# Patient Record
Sex: Female | Born: 1965 | Race: White | Hispanic: No | Marital: Married | Smoking: Former smoker
Health system: Southern US, Community
[De-identification: ages and names within clinical notes are randomized; demographics above are authoritative.]

## PROBLEM LIST (undated history)

## (undated) DIAGNOSIS — E079 Disorder of thyroid, unspecified: Secondary | ICD-10-CM

## (undated) DIAGNOSIS — N6019 Diffuse cystic mastopathy of unspecified breast: Secondary | ICD-10-CM

## (undated) DIAGNOSIS — B009 Herpesviral infection, unspecified: Secondary | ICD-10-CM

## (undated) DIAGNOSIS — K219 Gastro-esophageal reflux disease without esophagitis: Secondary | ICD-10-CM

## (undated) HISTORY — DX: Herpesviral infection, unspecified: B00.9

## (undated) HISTORY — PX: LAPAROSCOPY: SHX197

## (undated) HISTORY — DX: Diffuse cystic mastopathy of unspecified breast: N60.19

## (undated) HISTORY — DX: Disorder of thyroid, unspecified: E07.9

## (undated) HISTORY — DX: Gastro-esophageal reflux disease without esophagitis: K21.9

---

## 1997-12-23 ENCOUNTER — Ambulatory Visit (HOSPITAL_COMMUNITY): Admission: RE | Admit: 1997-12-23 | Discharge: 1997-12-23 | Payer: Self-pay | Admitting: Obstetrics and Gynecology

## 1998-07-21 ENCOUNTER — Encounter: Payer: Self-pay | Admitting: Obstetrics and Gynecology

## 1998-07-21 ENCOUNTER — Ambulatory Visit (HOSPITAL_COMMUNITY): Admission: RE | Admit: 1998-07-21 | Discharge: 1998-07-21 | Payer: Self-pay | Admitting: Obstetrics and Gynecology

## 2000-05-10 ENCOUNTER — Encounter: Payer: Self-pay | Admitting: Family Medicine

## 2000-05-10 ENCOUNTER — Encounter: Admission: RE | Admit: 2000-05-10 | Discharge: 2000-05-10 | Payer: Self-pay | Admitting: Family Medicine

## 2000-05-15 ENCOUNTER — Encounter: Payer: Self-pay | Admitting: Family Medicine

## 2000-05-15 ENCOUNTER — Encounter: Admission: RE | Admit: 2000-05-15 | Discharge: 2000-05-15 | Payer: Self-pay | Admitting: Family Medicine

## 2002-07-24 ENCOUNTER — Other Ambulatory Visit: Admission: RE | Admit: 2002-07-24 | Discharge: 2002-07-24 | Payer: Self-pay | Admitting: Family Medicine

## 2003-08-07 ENCOUNTER — Other Ambulatory Visit: Admission: RE | Admit: 2003-08-07 | Discharge: 2003-08-07 | Payer: Self-pay | Admitting: Family Medicine

## 2004-06-29 ENCOUNTER — Encounter: Admission: RE | Admit: 2004-06-29 | Discharge: 2004-06-29 | Payer: Self-pay | Admitting: Family Medicine

## 2004-12-08 ENCOUNTER — Other Ambulatory Visit: Admission: RE | Admit: 2004-12-08 | Discharge: 2004-12-08 | Payer: Self-pay | Admitting: Family Medicine

## 2005-08-08 ENCOUNTER — Encounter: Admission: RE | Admit: 2005-08-08 | Discharge: 2005-08-08 | Payer: Self-pay | Admitting: Family Medicine

## 2005-09-20 ENCOUNTER — Encounter: Admission: RE | Admit: 2005-09-20 | Discharge: 2005-09-20 | Payer: Self-pay | Admitting: Family Medicine

## 2005-11-20 ENCOUNTER — Other Ambulatory Visit: Admission: RE | Admit: 2005-11-20 | Discharge: 2005-11-20 | Payer: Self-pay | Admitting: Family Medicine

## 2006-03-06 ENCOUNTER — Encounter: Admission: RE | Admit: 2006-03-06 | Discharge: 2006-03-06 | Payer: Self-pay | Admitting: Family Medicine

## 2006-08-23 ENCOUNTER — Encounter: Admission: RE | Admit: 2006-08-23 | Discharge: 2006-08-23 | Payer: Self-pay | Admitting: Family Medicine

## 2007-02-20 ENCOUNTER — Encounter: Admission: RE | Admit: 2007-02-20 | Discharge: 2007-02-20 | Payer: Self-pay | Admitting: Family Medicine

## 2007-02-20 ENCOUNTER — Other Ambulatory Visit: Admission: RE | Admit: 2007-02-20 | Discharge: 2007-02-20 | Payer: Self-pay | Admitting: Family Medicine

## 2007-08-26 ENCOUNTER — Encounter: Admission: RE | Admit: 2007-08-26 | Discharge: 2007-08-26 | Payer: Self-pay | Admitting: Family Medicine

## 2007-09-04 ENCOUNTER — Encounter: Admission: RE | Admit: 2007-09-04 | Discharge: 2007-09-04 | Payer: Self-pay | Admitting: Family Medicine

## 2008-02-21 ENCOUNTER — Other Ambulatory Visit: Admission: RE | Admit: 2008-02-21 | Discharge: 2008-02-21 | Payer: Self-pay | Admitting: Family Medicine

## 2008-10-29 ENCOUNTER — Ambulatory Visit (HOSPITAL_COMMUNITY): Admission: RE | Admit: 2008-10-29 | Discharge: 2008-10-29 | Payer: Self-pay | Admitting: Family Medicine

## 2009-06-15 IMAGING — MG MM DIAGNOSTIC LTD RIGHT
2 series · 2 of 2 positions shown · non-contrast
Comparison: 08/26/2007, 08/23/2006, and 05/10/2000.

CLINICAL DATA: Abnormal screening mammogram with possible right
breast mass.

[REDACTED] RIGHT MAMMOGRAM

[R ML]
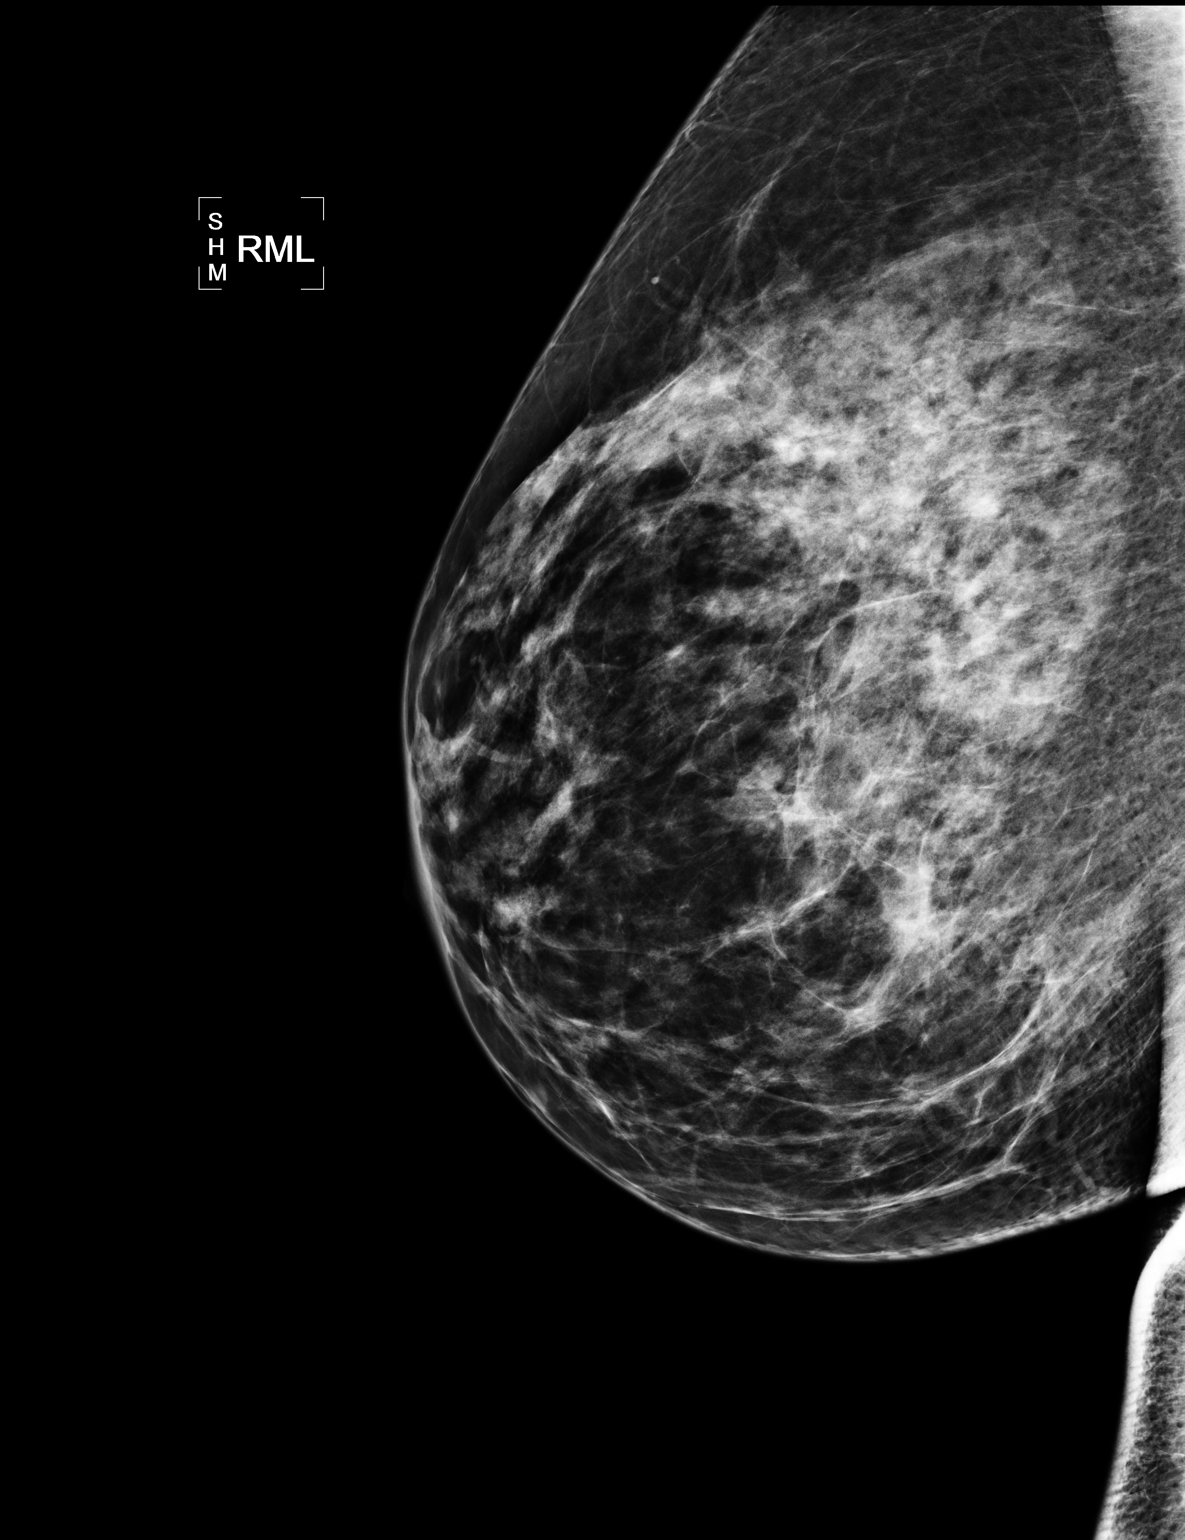

[R MLO]
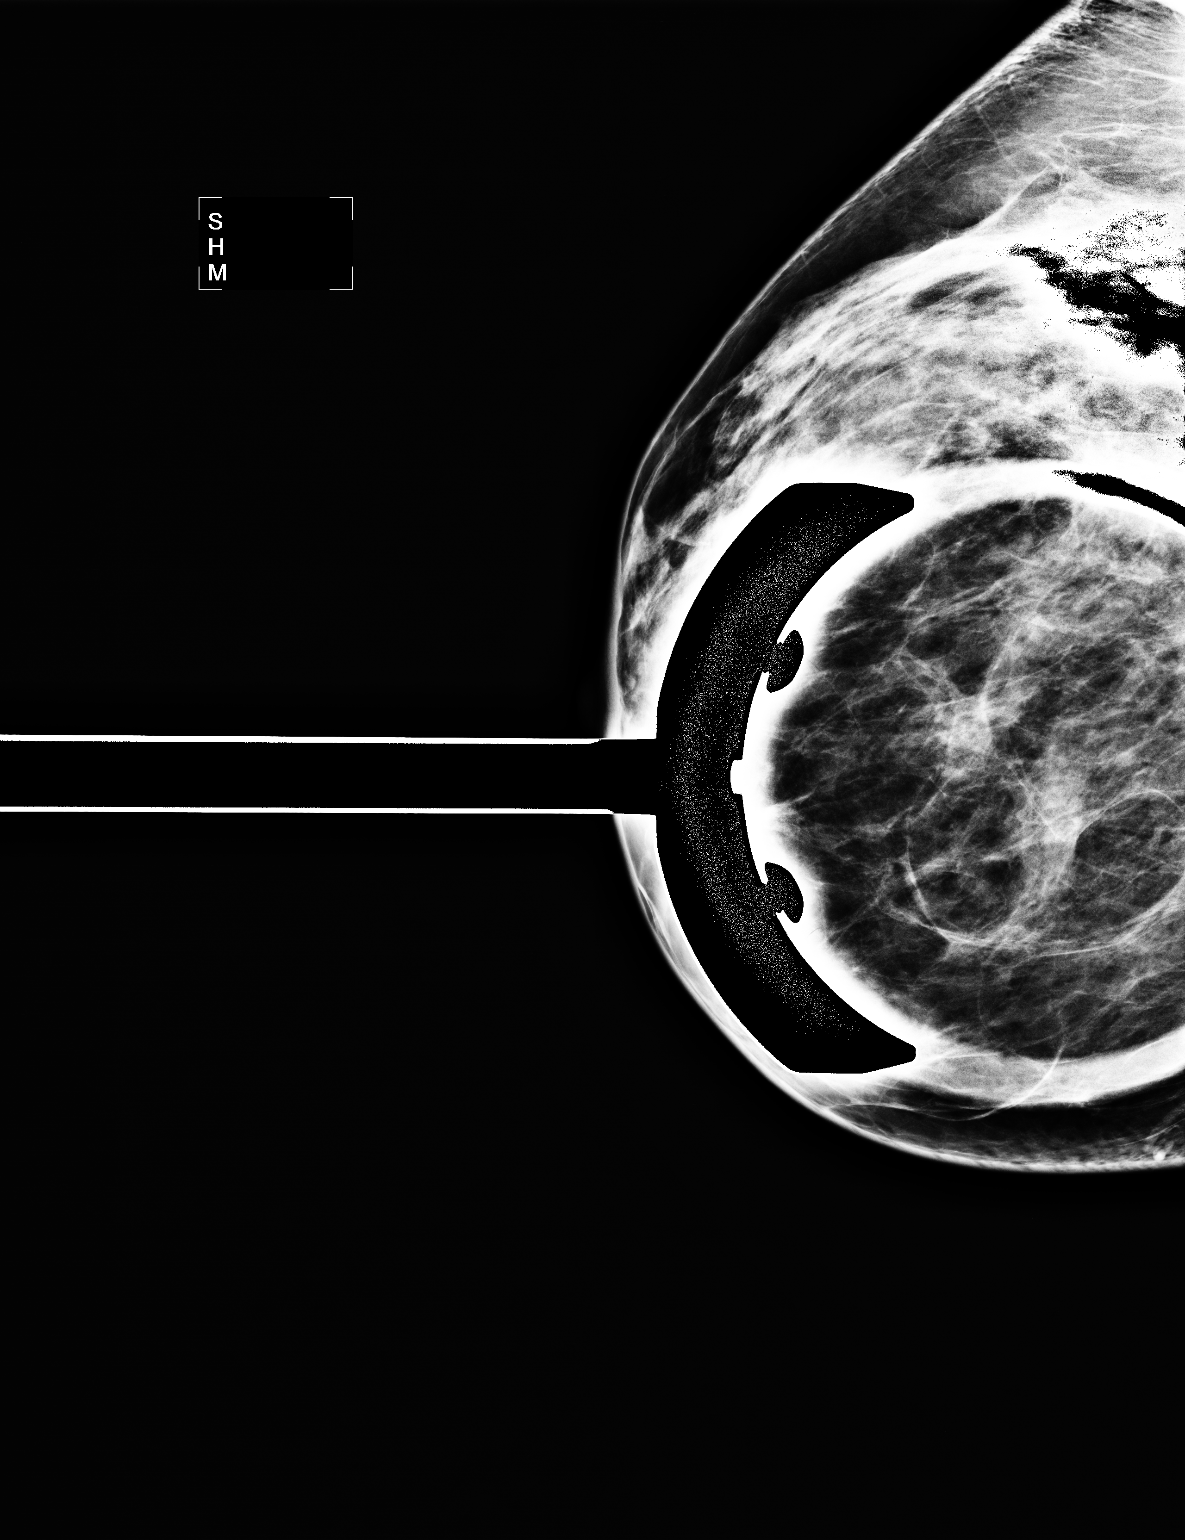

[2 of 2 positions shown; findings below may reference images not displayed]

FINDINGS: ML and spot compression MLO views of the right breast are
performed in the area of screening mammogram abnormality. There is
no evidence of mass, distortion, or suspicious calcifications.
Heterogeneously dense breast tissue again identified.
IMPRESSION: No specific mammographic evidence of malignancy or abnormality,
specifically in the area of screening mammogram abnormality.

These findings were discussed with the patient.  She was encouraged
to continue monthly self exams and to contact the [REDACTED] or
primary physician if any changes noted.

BI-RADS CATEGORY 1:  Negative.

Recommend bilateral screening mammogram in 1 year.

## 2009-11-16 ENCOUNTER — Other Ambulatory Visit: Admission: RE | Admit: 2009-11-16 | Discharge: 2009-11-16 | Payer: Self-pay | Admitting: Family Medicine

## 2009-12-28 ENCOUNTER — Ambulatory Visit (HOSPITAL_COMMUNITY)
Admission: RE | Admit: 2009-12-28 | Discharge: 2009-12-28 | Payer: Self-pay | Source: Home / Self Care | Admitting: Family Medicine

## 2010-02-14 ENCOUNTER — Encounter: Payer: Self-pay | Admitting: Family Medicine

## 2010-11-30 ENCOUNTER — Other Ambulatory Visit (HOSPITAL_COMMUNITY): Payer: Self-pay | Admitting: Family Medicine

## 2010-11-30 DIAGNOSIS — Z1231 Encounter for screening mammogram for malignant neoplasm of breast: Secondary | ICD-10-CM

## 2010-12-29 ENCOUNTER — Ambulatory Visit (HOSPITAL_COMMUNITY): Payer: Self-pay

## 2011-02-02 ENCOUNTER — Ambulatory Visit (HOSPITAL_COMMUNITY)
Admission: RE | Admit: 2011-02-02 | Discharge: 2011-02-02 | Disposition: A | Discharge status: Home / Self Care | Payer: BC Managed Care – PPO | Source: Ambulatory Visit | Attending: Family Medicine | Admitting: Family Medicine

## 2011-02-02 DIAGNOSIS — Z1231 Encounter for screening mammogram for malignant neoplasm of breast: Secondary | ICD-10-CM

## 2011-08-11 ENCOUNTER — Other Ambulatory Visit: Payer: Self-pay | Admitting: Family Medicine

## 2011-08-11 DIAGNOSIS — I889 Nonspecific lymphadenitis, unspecified: Secondary | ICD-10-CM

## 2011-08-14 ENCOUNTER — Other Ambulatory Visit: Payer: Self-pay

## 2011-08-29 ENCOUNTER — Other Ambulatory Visit: Payer: Self-pay

## 2011-08-29 ENCOUNTER — Ambulatory Visit
Admission: RE | Admit: 2011-08-29 | Discharge: 2011-08-29 | Disposition: A | Discharge status: Home / Self Care | Payer: BC Managed Care – PPO | Source: Ambulatory Visit | Attending: Family Medicine | Admitting: Family Medicine

## 2011-08-29 DIAGNOSIS — I889 Nonspecific lymphadenitis, unspecified: Secondary | ICD-10-CM

## 2011-08-29 MED ORDER — IOHEXOL 300 MG/ML  SOLN
75.0000 mL | Freq: Once | INTRAMUSCULAR | Status: AC | PRN
  Administered 2011-08-29: 75 mL via INTRAVENOUS

## 2011-08-31 ENCOUNTER — Other Ambulatory Visit: Payer: Self-pay | Admitting: Family Medicine

## 2011-08-31 ENCOUNTER — Ambulatory Visit
Admission: RE | Admit: 2011-08-31 | Discharge: 2011-08-31 | Disposition: A | Discharge status: Home / Self Care | Payer: BC Managed Care – PPO | Source: Ambulatory Visit | Attending: Family Medicine | Admitting: Family Medicine

## 2011-08-31 DIAGNOSIS — E049 Nontoxic goiter, unspecified: Secondary | ICD-10-CM

## 2011-09-04 ENCOUNTER — Other Ambulatory Visit: Payer: Self-pay

## 2012-01-08 ENCOUNTER — Other Ambulatory Visit: Payer: Self-pay | Admitting: Family Medicine

## 2012-01-08 DIAGNOSIS — Z1231 Encounter for screening mammogram for malignant neoplasm of breast: Secondary | ICD-10-CM

## 2012-02-06 ENCOUNTER — Ambulatory Visit
Admission: RE | Admit: 2012-02-06 | Discharge: 2012-02-06 | Disposition: A | Discharge status: Home / Self Care | Payer: PRIVATE HEALTH INSURANCE | Source: Ambulatory Visit | Attending: Family Medicine | Admitting: Family Medicine

## 2012-02-06 DIAGNOSIS — Z1231 Encounter for screening mammogram for malignant neoplasm of breast: Secondary | ICD-10-CM

## 2012-02-07 ENCOUNTER — Other Ambulatory Visit: Payer: Self-pay | Admitting: Family Medicine

## 2012-02-07 DIAGNOSIS — N644 Mastodynia: Secondary | ICD-10-CM

## 2012-02-07 DIAGNOSIS — N63 Unspecified lump in unspecified breast: Secondary | ICD-10-CM

## 2012-02-20 ENCOUNTER — Ambulatory Visit
Admission: RE | Admit: 2012-02-20 | Discharge: 2012-02-20 | Disposition: A | Discharge status: Home / Self Care | Payer: PRIVATE HEALTH INSURANCE | Source: Ambulatory Visit | Attending: Family Medicine | Admitting: Family Medicine

## 2012-02-20 DIAGNOSIS — N644 Mastodynia: Secondary | ICD-10-CM

## 2012-02-20 DIAGNOSIS — N63 Unspecified lump in unspecified breast: Secondary | ICD-10-CM

## 2012-02-29 ENCOUNTER — Other Ambulatory Visit: Payer: Self-pay | Admitting: Family Medicine

## 2012-02-29 DIAGNOSIS — E039 Hypothyroidism, unspecified: Secondary | ICD-10-CM

## 2012-03-04 ENCOUNTER — Ambulatory Visit
Admission: RE | Admit: 2012-03-04 | Discharge: 2012-03-04 | Disposition: A | Discharge status: Home / Self Care | Payer: PRIVATE HEALTH INSURANCE | Source: Ambulatory Visit | Attending: Family Medicine | Admitting: Family Medicine

## 2012-03-04 DIAGNOSIS — E039 Hypothyroidism, unspecified: Secondary | ICD-10-CM

## 2012-07-30 ENCOUNTER — Other Ambulatory Visit: Payer: Self-pay | Admitting: Family Medicine

## 2012-07-30 DIAGNOSIS — R109 Unspecified abdominal pain: Secondary | ICD-10-CM

## 2012-07-31 ENCOUNTER — Ambulatory Visit
Admission: RE | Admit: 2012-07-31 | Discharge: 2012-07-31 | Disposition: A | Discharge status: Home / Self Care | Payer: PRIVATE HEALTH INSURANCE | Source: Ambulatory Visit | Attending: Family Medicine | Admitting: Family Medicine

## 2012-07-31 DIAGNOSIS — R109 Unspecified abdominal pain: Secondary | ICD-10-CM

## 2012-08-07 ENCOUNTER — Encounter (INDEPENDENT_AMBULATORY_CARE_PROVIDER_SITE_OTHER): Payer: Self-pay | Admitting: Surgery

## 2012-08-13 ENCOUNTER — Ambulatory Visit (INDEPENDENT_AMBULATORY_CARE_PROVIDER_SITE_OTHER): Payer: PRIVATE HEALTH INSURANCE | Admitting: Surgery

## 2012-08-13 ENCOUNTER — Telehealth (INDEPENDENT_AMBULATORY_CARE_PROVIDER_SITE_OTHER): Payer: Self-pay | Admitting: General Surgery

## 2012-08-13 NOTE — Telephone Encounter (Signed)
LMOM for patient to call back and ask for [facility] patient needs to be R/S

## 2012-08-15 ENCOUNTER — Ambulatory Visit (INDEPENDENT_AMBULATORY_CARE_PROVIDER_SITE_OTHER): Payer: PRIVATE HEALTH INSURANCE | Admitting: Surgery

## 2012-08-15 ENCOUNTER — Encounter (INDEPENDENT_AMBULATORY_CARE_PROVIDER_SITE_OTHER): Payer: Self-pay

## 2012-08-15 ENCOUNTER — Encounter (INDEPENDENT_AMBULATORY_CARE_PROVIDER_SITE_OTHER): Payer: Self-pay | Admitting: Surgery

## 2012-08-15 ENCOUNTER — Telehealth (INDEPENDENT_AMBULATORY_CARE_PROVIDER_SITE_OTHER): Payer: Self-pay | Admitting: Surgery

## 2012-08-15 VITALS — BP 118/68 | HR 72 | Temp 98.0°F | Resp 15 | Ht 59.0 in | Wt 169.4 lb

## 2012-08-15 DIAGNOSIS — K8010 Calculus of gallbladder with chronic cholecystitis without obstruction: Secondary | ICD-10-CM

## 2012-08-15 NOTE — Telephone Encounter (Signed)
Patient met with surgery scheduling given financial responsibilities, patient will call back to schedule °

## 2012-08-15 NOTE — Progress Notes (Signed)
Patient ID: Lydia Dillon, female   DOB: 03-05-1965, 47 y.o.   MRN: [id number]  Chief Complaint  Patient presents with  . New Evaluation    eval gb/gallstones    HPI Lydia Dillon is a 47 y.o. female.  Referred by Dr. Maurice Small for evaluation of gallbladder disease  HPI 47 year old female in good health who presents with a one-year history of frequent episodes of postprandial diarrhea. Over the last several weeks she has developed some right-sided abdominal pain with radiation to her back and abdominal cramping which seems to be exacerbated by eating. She's also had an abdominal bloating and mild nausea. Her diarrhea has become more frequent.She had an ultrasound showed multiple gallstones. At that time she did have a positive sonographic Murphy's sign. However her pain has decreased since that time.   Past Medical History  Diagnosis Date  . GERD (gastroesophageal reflux disease)   . Thyroid disease   . Fibrocystic breast disease   . Herpes simplex virus infection     Past Surgical History  Procedure Laterality Date  . Laparoscopy      Family History  Problem Relation Age of Onset  . Cancer Mother     breast  . Heart disease Father   . Cancer Paternal Grandmother     breast    Social History History  Substance Use Topics  . Smoking status: Former Smoker    Quit date: 08/08/1998  . Smokeless tobacco: Never Used  . Alcohol Use: 0.0 oz/week    1-2 Glasses of wine per week    No Known Allergies  Current Outpatient Prescriptions  Medication Sig Dispense Refill  . thyroid (ARMOUR) 90 MG tablet Take 90 mg by mouth daily.       No current facility-administered medications for this visit.    Review of Systems Review of Systems  Constitutional: Negative for fever, chills and unexpected weight change.  HENT: Negative for hearing loss, congestion, sore throat, trouble swallowing and voice change.   Eyes: Negative for visual disturbance.  Respiratory: Negative for  cough and wheezing.   Cardiovascular: Negative for chest pain, palpitations and leg swelling.  Gastrointestinal: Positive for nausea, abdominal pain, diarrhea and abdominal distention. Negative for vomiting, constipation, blood in stool and anal bleeding.  Genitourinary: Negative for hematuria, vaginal bleeding and difficulty urinating.  Musculoskeletal: Negative for arthralgias.  Skin: Negative for rash and wound.  Neurological: Negative for seizures, syncope and headaches.  Hematological: Negative for adenopathy. Does not bruise/bleed easily.  Psychiatric/Behavioral: Negative for confusion.    Blood pressure 118/68, pulse 72, temperature 98 F (36.7 C), temperature source Temporal, resp. rate 15, height 4\' 11"  (1.499 m), weight 169 lb 6.4 oz (76.839 kg).  Physical Exam Physical Exam WDWN in NAD HEENT:  EOMI, sclera anicteric Neck:  No masses, no thyromegaly Lungs:  CTA bilaterally; normal respiratory effort CV:  Regular rate and rhythm; no murmurs Abd:  +bowel sounds, soft, mild RUQ tenderness; no palpable masses Ext:  Well-perfused; no edema Skin:  Warm, dry; no sign of jaundice  Data Reviewed RADIOLOGY REPORT*  Clinical Data: Abdominal pain  COMPLETE ABDOMINAL ULTRASOUND  Comparison: None.  Findings:  Gallbladder: Multiple layering gallstones. No gallbladder wall  thickening or pericholecystic fluid. Positive sonogram Murphy's  sign.  Common bile duct: Measures 2 mm.  Liver: No focal lesion identified. Within normal limits in  parenchymal echogenicity.  IVC: Appears normal.  Pancreas: Visualized portions are within normal limits.  Spleen: Measures 4.3 cm.  Right Kidney: Measures  10.6 cm. No mass or hydronephrosis.  Left Kidney: Measures 11.6 cm. No mass or hydronephrosis.  Abdominal aorta: No aneurysm identified.  IMPRESSION:  Cholelithiasis with positive sonographic Murphy's sign.  No associated sonographic findings to suggest acute cholecystitis.  Original Report  Authenticated By: Charline Bills, M.D.    WBC 5.6 hgb 12.8 Plt 242 Electrolytes WNL LFT's WNL except alk phos of 128  Assessment    Chronic calculus cholecystitis.     Plan    Laparoscopic cholecystectomy with intraoperative cholangiogram. The surgical procedure has been discussed with the patient.  Potential risks, benefits, alternative treatments, and expected outcomes have been explained.  All of the patient's questions at this time have been answered.  The likelihood of reaching the patient's treatment goal is good.  The patient understand the proposed surgical procedure and wishes to proceed.         Rosbel Buckner K. 08/15/2012, 12:25 PM

## 2012-09-16 ENCOUNTER — Encounter (INDEPENDENT_AMBULATORY_CARE_PROVIDER_SITE_OTHER): Payer: PRIVATE HEALTH INSURANCE | Admitting: Surgery

## 2012-09-16 ENCOUNTER — Other Ambulatory Visit: Payer: Self-pay | Admitting: *Deleted

## 2012-09-16 DIAGNOSIS — E039 Hypothyroidism, unspecified: Secondary | ICD-10-CM

## 2012-09-25 ENCOUNTER — Other Ambulatory Visit: Payer: PRIVATE HEALTH INSURANCE

## 2012-12-09 ENCOUNTER — Other Ambulatory Visit: Payer: Self-pay | Admitting: *Deleted

## 2012-12-09 MED ORDER — THYROID 90 MG PO TABS: 90.0000 mg | ORAL_TABLET | Freq: Every day | ORAL | Status: DC

## 2013-02-04 ENCOUNTER — Other Ambulatory Visit: Payer: Self-pay | Admitting: Family Medicine

## 2013-02-04 DIAGNOSIS — N63 Unspecified lump in unspecified breast: Secondary | ICD-10-CM

## 2013-02-04 DIAGNOSIS — N644 Mastodynia: Secondary | ICD-10-CM

## 2013-02-12 ENCOUNTER — Ambulatory Visit
Admission: RE | Admit: 2013-02-12 | Discharge: 2013-02-12 | Disposition: A | Discharge status: Home / Self Care | Payer: BC Managed Care – PPO | Source: Ambulatory Visit | Attending: Family Medicine | Admitting: Family Medicine

## 2013-02-12 DIAGNOSIS — N63 Unspecified lump in unspecified breast: Secondary | ICD-10-CM

## 2013-02-12 DIAGNOSIS — N644 Mastodynia: Secondary | ICD-10-CM

## 2013-03-21 ENCOUNTER — Encounter (INDEPENDENT_AMBULATORY_CARE_PROVIDER_SITE_OTHER): Payer: Self-pay | Admitting: Surgery

## 2013-03-31 ENCOUNTER — Ambulatory Visit (INDEPENDENT_AMBULATORY_CARE_PROVIDER_SITE_OTHER): Payer: PRIVATE HEALTH INSURANCE | Admitting: Surgery

## 2013-04-08 ENCOUNTER — Encounter (INDEPENDENT_AMBULATORY_CARE_PROVIDER_SITE_OTHER): Payer: Self-pay | Admitting: Surgery

## 2013-04-08 ENCOUNTER — Ambulatory Visit (INDEPENDENT_AMBULATORY_CARE_PROVIDER_SITE_OTHER): Payer: BC Managed Care – PPO | Admitting: Surgery

## 2013-04-08 VITALS — BP 133/86 | HR 72 | Temp 97.6°F | Resp 18 | Ht 59.0 in | Wt 170.2 lb

## 2013-04-08 DIAGNOSIS — N6011 Diffuse cystic mastopathy of right breast: Secondary | ICD-10-CM | POA: Insufficient documentation

## 2013-04-08 DIAGNOSIS — N6012 Diffuse cystic mastopathy of left breast: Principal | ICD-10-CM

## 2013-04-08 DIAGNOSIS — N644 Mastodynia: Secondary | ICD-10-CM | POA: Insufficient documentation

## 2013-04-08 DIAGNOSIS — N6019 Diffuse cystic mastopathy of unspecified breast: Secondary | ICD-10-CM

## 2013-04-08 NOTE — Patient Instructions (Signed)
Heating pad, Evening Primrose oil, Ibuprofen, reduce caffeine

## 2013-04-08 NOTE — Progress Notes (Signed)
Patient ID: Lydia Dillon, female   DOB: 1965/09/18, 48 y.o.   MRN: [id number]  Chief Complaint  Patient presents with  . Breast Problem    HPI Lydia Dillon is a 48 y.o. female.  Referred by Dr. Maurice SmallElaine Griffin for evaluation of breast tenderness  HPI This is a 48 year old female who presents with a six-month history of bilateral lateral breast tenderness. The patient has felt some nodularity and increasing tenderness on the lateral sides of both breasts. No sign of infection. No Nipple discharge. No overlying skin changes. Mammogram was unremarkable but the patient does have dense breast tissue. The patient has been going through menopause and has severe hot flashes.  The patient was evaluated last year for gallbladder disease but chose not to have surgery. She gets occasional mild symptoms of epigastric and right upper quadrant pain with greasy foods. She also gets occasional nausea. She still does not want to have surgery.  Menarche age 48 Nulliparous No hormone usage Menopause age 48 Family history of breast cancer in her mother who passed away at age 50 of stage IV breast cancer. She also has a paternal grandmother and a paternal aunt that has had breast cancer.    Past Medical History  Diagnosis Date  . GERD (gastroesophageal reflux disease)   . Thyroid disease   . Fibrocystic breast disease   . Herpes simplex virus infection     Past Surgical History  Procedure Laterality Date  . Laparoscopy      Family History  Problem Relation Age of Onset  . Cancer Mother     breast  . Heart disease Father   . Cancer Paternal Grandmother     breast    Social History History  Substance Use Topics  . Smoking status: Former Smoker    Quit date: 08/08/1998  . Smokeless tobacco: Never Used  . Alcohol Use: 0.0 oz/week    1-2 Glasses of wine per week    No Known Allergies  Current Outpatient Prescriptions  Medication Sig Dispense Refill  . meloxicam (MOBIC) 15 MG tablet        . thyroid (ARMOUR) 90 MG tablet Take 1 tablet (90 mg total) by mouth daily.  32 tablet  3   No current facility-administered medications for this visit.    Review of Systems Review of Systems  Constitutional: Negative for fever, chills and unexpected weight change.  HENT: Negative for congestion, hearing loss, sore throat, trouble swallowing and voice change.   Eyes: Negative for visual disturbance.  Respiratory: Negative for cough and wheezing.   Cardiovascular: Negative for chest pain, palpitations and leg swelling.  Gastrointestinal: Negative for nausea, vomiting, abdominal pain, diarrhea, constipation, blood in stool, abdominal distention and anal bleeding.  Genitourinary: Negative for hematuria, vaginal bleeding and difficulty urinating.  Musculoskeletal: Negative for arthralgias.  Skin: Negative for rash and wound.  Neurological: Negative for seizures, syncope and headaches.  Hematological: Negative for adenopathy. Does not bruise/bleed easily.  Psychiatric/Behavioral: Negative for confusion.    Blood pressure 133/86, pulse 72, temperature 97.6 F (36.4 C), temperature source Temporal, resp. rate 18, height 4\' 11"  (1.499 m), weight 170 lb 3.2 oz (77.202 kg).  Physical Exam Physical Exam WDWN in NAD Breasts - No nipple retraction or discharge, and the lateral half of each breast shows mild fibrocystic changes with some associated tenderness. No dominant masses. No axillary lymphadenopathy. Abdomen-soft, nontender, no palpable masses  Data Reviewed CLINICAL DATA: Palpable abnormalities in the upper-outer quadrants  bilaterally. Focal tenderness  in the lower outer quadrants  bilaterally.  EXAM:  DIGITAL DIAGNOSTIC bilateral MAMMOGRAM WITH CAD  ULTRASOUND bilateral BREAST  COMPARISON: 02/20/2012 and earlier  ACR Breast Density Category c: The breast tissue is heterogeneously  dense, which may obscure small masses.  FINDINGS:  No suspicious mass, distortion, or  microcalcifications are  identified to suggest presence of malignancy. Spot tangential view  in the areas of concern bilaterally are negative.  Mammographic images were processed with CAD.  On physical exam, I palpate focal thickening in the upper-outer  quadrants bilaterally. I palpate no discrete mass in the areas of  concern in either breast.  Ultrasound is performed, showing normal appearing, dense  fibroglandular tissue throughout the upper outer quadrants  bilaterally. Within the lower outer quadrants bilaterally, normal  breast tissue is imaged. No mass, distortion, or acoustic shadowing  is demonstrated with ultrasound. .  IMPRESSION:  1. No mammographic or ultrasound evidence for malignancy.  2. No mammographic or sonographic abnormality in the areas of  concern bilaterally.  RECOMMENDATION:  Screening mammogram in one year.(Code:[id number]-B-01Y)  I have discussed the findings and recommendations with the patient.  Results were also provided in writing at the conclusion of the  visit.  BI-RADS CATEGORY 1: Negative.  Electronically Signed  By: Rosalie Gums M.D.  On: 02/12/2013 16:13    Assessment    Fibrocystic changes with bilateral breast tenderness, but no areas on mammogram or physical examination that would warrant biopsy at this time. Increased breast cancer risk with first degree relative and nulliparity     Plan    Limit caffeine intake Heating pad/ NSAID's  Referral to High-Risk breast cancer clinic for evaluation.  She may require a MRI to further evaluate her dense breast tissue.  Follow-up PRN for gallbladder symptoms.         Jayant Kriz K. 04/08/2013, 4:29 PM

## 2013-04-15 ENCOUNTER — Telehealth (INDEPENDENT_AMBULATORY_CARE_PROVIDER_SITE_OTHER): Payer: Self-pay | Admitting: *Deleted

## 2013-04-15 NOTE — Telephone Encounter (Signed)
Pt called inquiring about a referral to Dr. Milta DeitersKhan's office from Dr. Corliss Skainssuei. Her referral has been sent to Dr. Milta DeitersKhan's office and they are awaiting an approval for an appt by Dr. Welton FlakesKhan and they will contact pt with appt information.

## 2013-04-16 NOTE — Telephone Encounter (Signed)
Pt called and was given message regarding her referral to oncology.  Told her they have the referral and will call her with the appt information.  Pt understands and will wait to hear from the Cancer Center.

## 2013-04-22 ENCOUNTER — Telehealth: Payer: Self-pay | Admitting: Oncology

## 2013-04-22 NOTE — Telephone Encounter (Signed)
LEFT MESSAGE FOR PATIENT TO RETURN CALL TO SCHEDULE HIGH RISK APPT.  °

## 2013-04-26 ENCOUNTER — Other Ambulatory Visit: Payer: Self-pay | Admitting: Endocrinology

## 2013-04-29 ENCOUNTER — Other Ambulatory Visit: Payer: Self-pay | Admitting: Endocrinology

## 2013-05-01 ENCOUNTER — Ambulatory Visit (INDEPENDENT_AMBULATORY_CARE_PROVIDER_SITE_OTHER): Payer: BC Managed Care – PPO | Admitting: Endocrinology

## 2013-05-01 ENCOUNTER — Encounter: Payer: Self-pay | Admitting: Endocrinology

## 2013-05-01 VITALS — BP 118/70 | HR 79 | Temp 98.5°F | Ht 59.0 in | Wt 171.0 lb

## 2013-05-01 DIAGNOSIS — E039 Hypothyroidism, unspecified: Secondary | ICD-10-CM

## 2013-05-01 MED ORDER — THYROID 90 MG PO TABS: 90.0000 mg | ORAL_TABLET | Freq: Every day | ORAL | Status: DC

## 2013-05-01 NOTE — Progress Notes (Signed)
Patient ID: Lydia Dillon, female   DOB: 1965-04-13, 48 y.o.   MRN: [id number]    Reason for Appointment:  Hypothyroidism, followup visit   History of Present Illness:   The hypothyroidism was first diagnosed about  15 yrs ago  Her previous detailed records available at this time She believes that her initial symptoms at diagnosis were fatigue, lethargy, brain fog and hair loss. She had been taking Synthroid for several years However because of symptoms of being tired and lethargic as well as difficulty concentrating she had requested a trial of Armour Thyroid over a year ago With this she has had significant improvement in her energy level She has not been seen in followup for about 10 months  She has been feeling fairly well except some sleepiness occasionally in the afternoon and occasionally a little fogginess of the brain. No hair loss or cold intolerance. She also has not able to lose weight despite usually trying to watch her diet. Only recently starting exercise regimen  Her Armour thyroid dose has been adjusted somewhat, on the last visit was advised to take 90 mg daily but only half tablet on Sundays     The patient is taking the thyroid supplement very regularly in the morning before breakfast. Not taking any calcium or iron supplements with the thyroid supple  Office Visit on 05/01/2013  Component Date Value Ref Range Status  . TSH 05/01/2013 6.42* 0.35 - 5.50 uIU/mL Final  . Free T4 05/01/2013 0.44* 0.60 - 1.60 ng/dL Final      Medication List       This list is accurate as of: 05/01/13 11:59 PM.  Always use your most recent med list.               meloxicam 15 MG tablet  Commonly known as:  MOBIC     thyroid 90 MG tablet  Commonly known as:  ARMOUR  Take 1 tablet (90 mg total) by mouth daily.        Allergies: No Known Allergies  Past Medical History  Diagnosis Date  . GERD (gastroesophageal reflux disease)   . Thyroid disease   . Fibrocystic breast  disease   . Herpes simplex virus infection     Past Surgical History  Procedure Laterality Date  . Laparoscopy      Family History  Problem Relation Age of Onset  . Cancer Mother     breast  . Heart disease Father   . Cancer Paternal Grandmother     breast  . Thyroid cancer Maternal Aunt     Social History:  reports that she quit smoking about 14 years ago. She has never used smokeless tobacco. She reports that she drinks alcohol. She reports that she does not use illicit drugs.  REVIEW Of SYSTEMS:  No history of hypertension or diabetes    Examination:   BP 118/70  Pulse 79  Temp(Src) 98.5 F (36.9 C) (Oral)  Ht 4\' 11"  (1.499 m)  Wt 171 lb (77.565 kg)  BMI 34.52 kg/m2  SpO2 97%  GENERAL APPEARANCE: Has generalized obesity; no puffiness of the face or eyes  NECK: Thyroid is not palpable           NEUROLOGIC EXAM:  biceps reflexes show normal relaxation Skin: Not unusual dry    Assessment   Hypothyroidism, long-standing and subjectively doing better with Armour Thyroid Overall clinically she is doing well with no hypothyroid symptoms and looks euthyroid   Treatment:  Check thyroid  levels and decide on dosage  Reather Littler 05/04/2013, 1:42 PM   Addendum: TSH mildly increased reflecting recent nonadherence, will continue same dose and followup in 6 months

## 2013-05-02 LAB — TSH: TSH: 6.42 u[IU]/mL — ABNORMAL HIGH (ref 0.35–5.50)

## 2013-05-02 LAB — T4, FREE: Free T4: 0.44 ng/dL — ABNORMAL LOW (ref 0.60–1.60)

## 2013-05-02 NOTE — Progress Notes (Signed)
Quick Note:  Please let patient know that the thyroid test is slightly low as expected since she missed 2 days, to continue same and followup in 6 months ______

## 2013-05-04 DIAGNOSIS — E039 Hypothyroidism, unspecified: Secondary | ICD-10-CM | POA: Insufficient documentation

## 2013-06-11 IMAGING — US US SOFT TISSUE HEAD/NECK
1 series · 14 of 25 positions shown · non-contrast
Comparison: Ultrasound of the thyroid of 03/06/2006

CLINICAL DATA: Thyroid goiter

THYROID ULTRASOUND
TECHNIQUE: Ultrasound examination of the thyroid gland and adjacent
soft tissues was performed.

[Series 1: us soft tissue head/neck · 0.04mm/px · 14 of 47 slices shown]
[im 1/47]
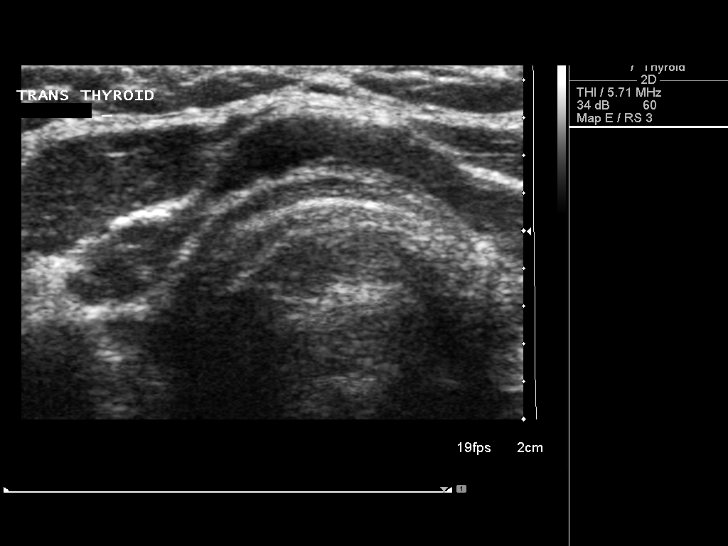
[im 4/47]
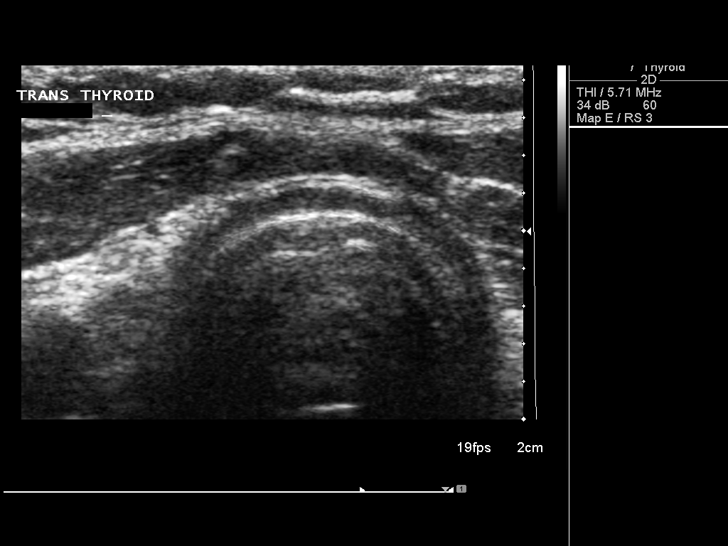
[im 8/47]
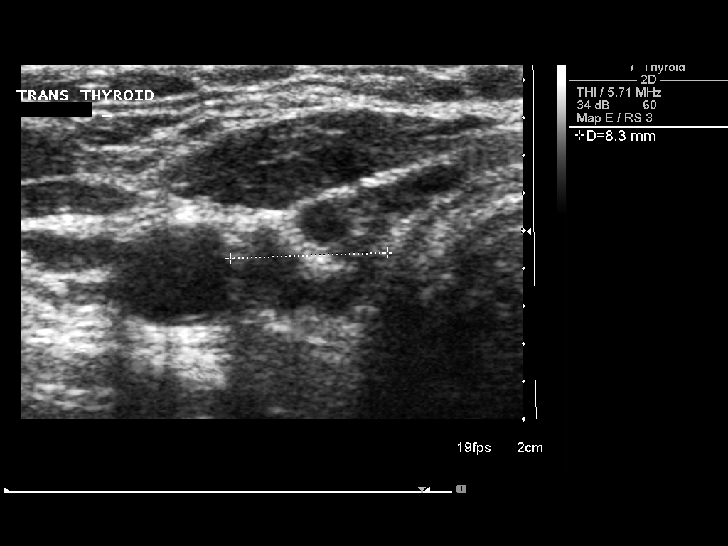
[im 12/47]
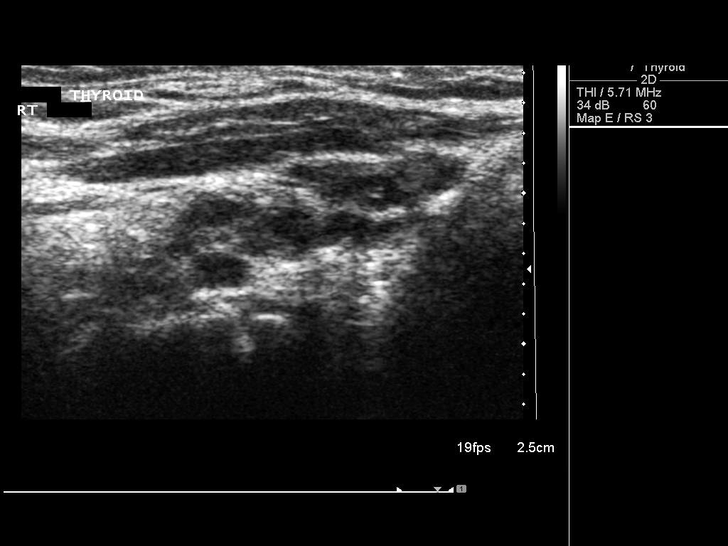
[im 16/47]
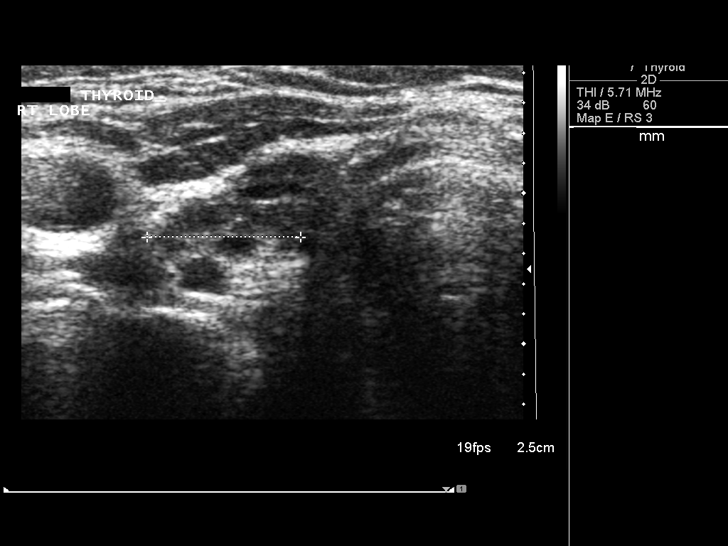
[im 18/47]
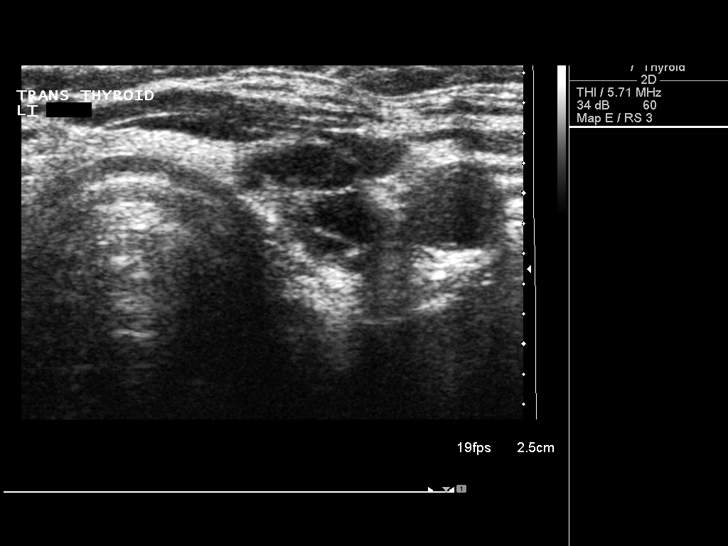
[im 22/47]
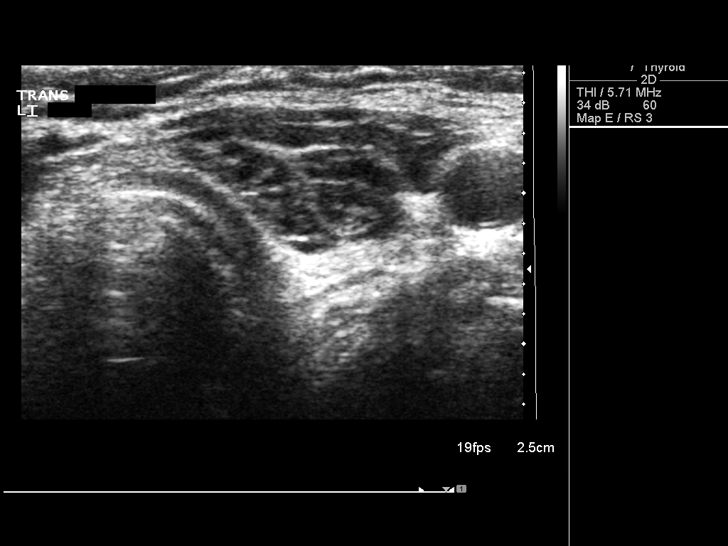
[im 25/47]
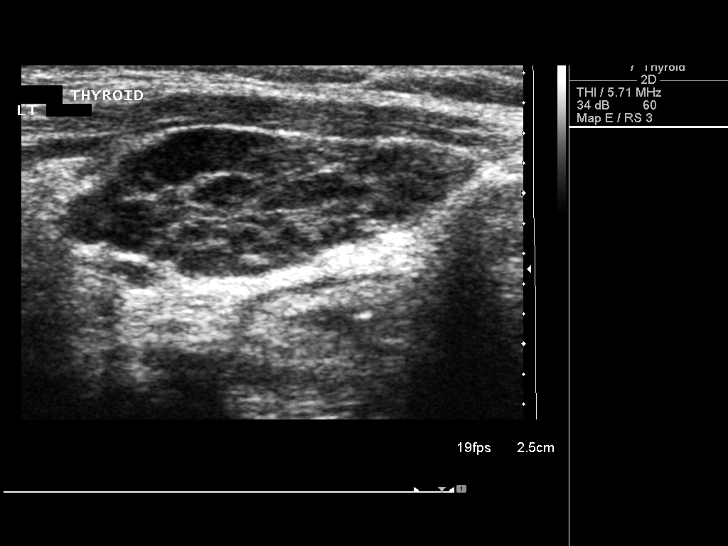
[im 29/47]
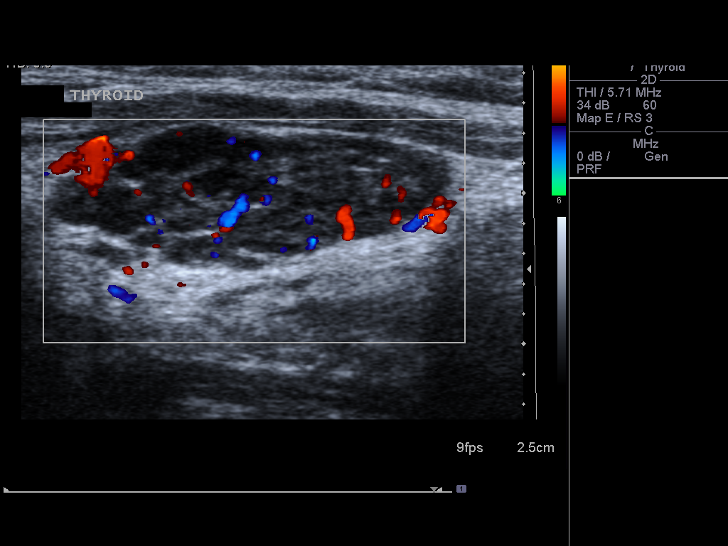
[im 31/47]
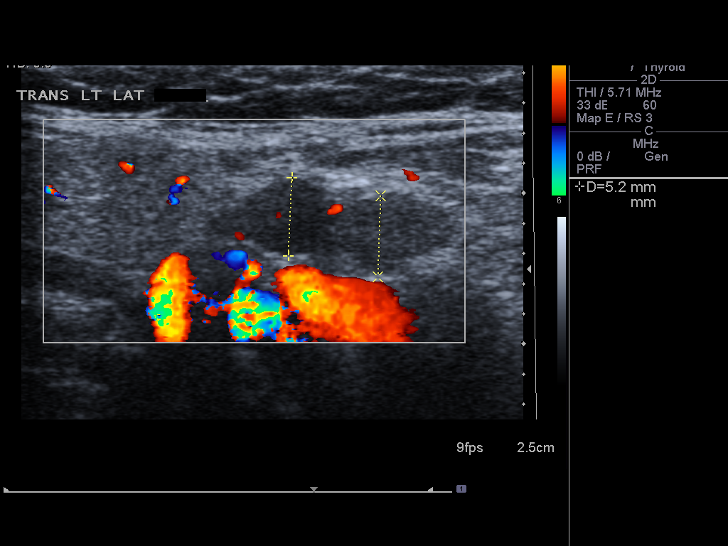
[im 35/47]
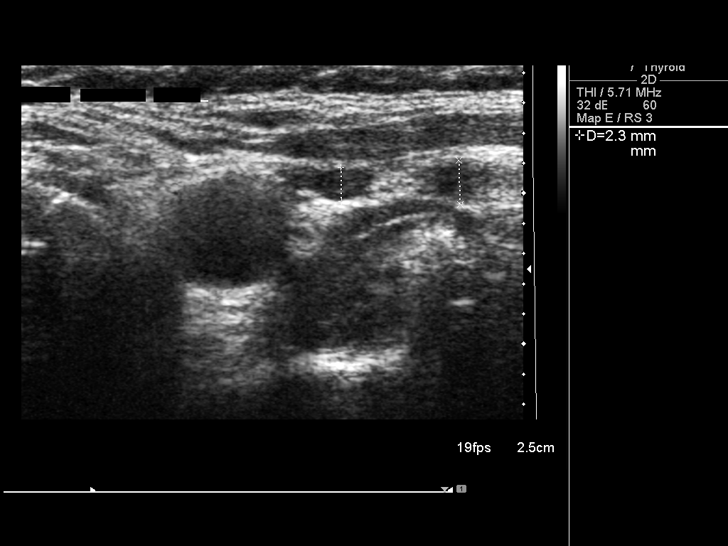
[im 39/47]
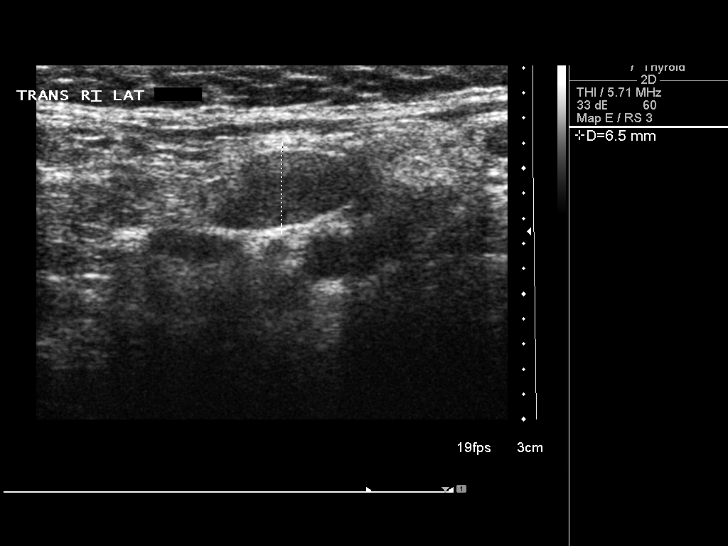
[im 43/47]
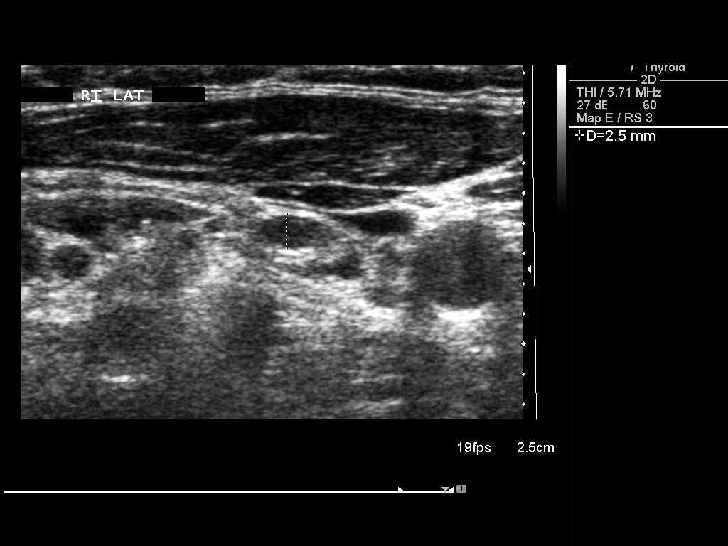
[im 47/47]
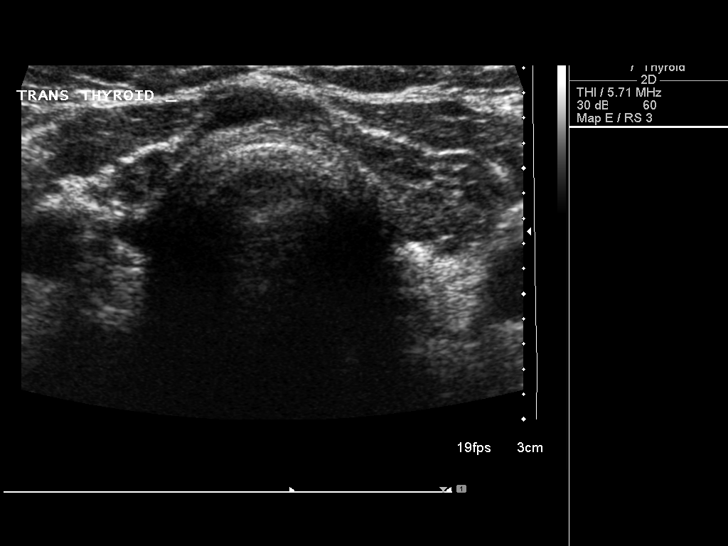

[14 of 25 positions shown; findings below may reference images not displayed]

FINDINGS: Right thyroid lobe:  2.2 x 0.7 x 1.0 cm.  (Previously 2.5 x 1.0 x
0.9 cm).
Left thyroid lobe:  2.9 x 1.0 x 1.2 cm.  (Previously 2.8 x 1.0 x
1.0 cm).
Isthmus:  3.1 mm in thickness.

Focal nodules:  The echogenicity of the thyroid tissue is very
inhomogeneous.  No solid or cystic nodule is seen.

Lymphadenopathy:  None visualized.
IMPRESSION: Small inhomogeneous thyroid.  No discrete nodule.

## 2013-06-20 ENCOUNTER — Telehealth: Payer: Self-pay | Admitting: *Deleted

## 2013-06-20 NOTE — Telephone Encounter (Signed)
Patient called and states she has an appointment with Dr. Welton Flakes in July for high risk clinic.  She had noticed a lump in both breasts back in January and had a mammo and u/s which did not show anything.  She does have dense breasts.  She has noticed lately that these areas have gotten bigger and painful.  Instructed her to call the breast center for another mammo/U/S since the areas have changed.  Instructed her to call after she has this done.  We will get her rescheduled with another physician. Patient verbalized understanding.

## 2013-07-28 ENCOUNTER — Other Ambulatory Visit: Payer: Self-pay | Admitting: *Deleted

## 2013-07-28 ENCOUNTER — Other Ambulatory Visit: Payer: BC Managed Care – PPO

## 2013-07-28 DIAGNOSIS — E039 Hypothyroidism, unspecified: Secondary | ICD-10-CM

## 2013-07-31 ENCOUNTER — Ambulatory Visit: Payer: BC Managed Care – PPO | Admitting: Endocrinology

## 2013-08-04 ENCOUNTER — Other Ambulatory Visit (INDEPENDENT_AMBULATORY_CARE_PROVIDER_SITE_OTHER): Payer: BC Managed Care – PPO

## 2013-08-04 DIAGNOSIS — E039 Hypothyroidism, unspecified: Secondary | ICD-10-CM

## 2013-08-05 LAB — TSH: TSH: 7.6 u[IU]/mL — ABNORMAL HIGH (ref 0.35–4.50)

## 2013-08-05 LAB — T4, FREE: Free T4: 0.7 ng/dL (ref 0.60–1.60)

## 2013-08-07 ENCOUNTER — Encounter: Payer: Self-pay | Admitting: Endocrinology

## 2013-08-07 ENCOUNTER — Ambulatory Visit (INDEPENDENT_AMBULATORY_CARE_PROVIDER_SITE_OTHER): Payer: BC Managed Care – PPO | Admitting: Endocrinology

## 2013-08-07 VITALS — BP 129/69 | HR 72 | Temp 97.6°F | Resp 16 | Ht 59.0 in | Wt 173.8 lb

## 2013-08-07 DIAGNOSIS — E039 Hypothyroidism, unspecified: Secondary | ICD-10-CM

## 2013-08-07 NOTE — Patient Instructions (Signed)
Take 1 pill daily and extra 1/2 on Sundays

## 2013-08-07 NOTE — Progress Notes (Signed)
Patient ID: Lydia Dillon, female   DOB: 06-20-1965, 48 y.o.   MRN: [id number]    Reason for Appointment:  Hypothyroidism, followup visit   History of Present Illness:   The hypothyroidism was first diagnosed about  15 yrs ago  Her previous detailed records available at this time She believes that her initial symptoms at diagnosis were fatigue, lethargy, brain fog and hair loss. She had been taking Synthroid for several years However because of symptoms of being tired and lethargic as well as difficulty concentrating she had requested  Armour Thyroid in 2014 this she has had significant improvement in her energy level Her Armour thyroid dose has been adjusted since her first prescription and more recently had been taking 6-1/2 tablets a week  On her visit in 4/15 her TSH was increased but she had missed 2 doses before the visit  She has been feeling fairly well except some sleepiness occasionally which she thinks is nonspecific. No hair loss or cold intolerance. Also is still concerned about her difficulty losing weight    The patient is taking the thyroid supplement very regularly in the morning before breakfast.  Not taking any calcium or iron supplements with her Armour thyroid  Lab Results  Component Value Date   FREET4 0.70 08/04/2013   FREET4 0.44* 05/01/2013   TSH 7.60* 08/04/2013   TSH 6.42* 05/01/2013       Medication List       This list is accurate as of: 08/07/13 10:52 AM.  Always use your most recent med list.               thyroid 90 MG tablet  Commonly known as:  ARMOUR  Take 1 tablet (90 mg total) by mouth daily.        Allergies: No Known Allergies  Past Medical History  Diagnosis Date  . GERD (gastroesophageal reflux disease)   . Thyroid disease   . Fibrocystic breast disease   . Herpes simplex virus infection     Past Surgical History  Procedure Laterality Date  . Laparoscopy      Family History  Problem Relation Age of Onset  . Cancer  Mother     breast  . Heart disease Father   . Cancer Paternal Grandmother     breast  . Thyroid cancer Maternal Aunt     Social History:  reports that she quit smoking about 15 years ago. She has never used smokeless tobacco. She reports that she drinks alcohol. She reports that she does not use illicit drugs.  REVIEW Of SYSTEMS:  Wt Readings from Last 3 Encounters:  08/07/13 173 lb 12.8 oz (78.835 kg)  05/01/13 171 lb (77.565 kg)  04/08/13 170 lb 3.2 oz (77.202 kg)    No history of hypertension or diabetes   Examination:   BP 129/69  Pulse 72  Temp(Src) 97.6 F (36.4 C)  Resp 16  Ht 4\' 11"  (1.499 m)  Wt 173 lb 12.8 oz (78.835 kg)  BMI 35.08 kg/m2  SpO2 95%  GENERAL APPEARANCE: Has generalized obesity; no puffiness of the face          NEUROLOGIC EXAM:  biceps reflexes show normal relaxation Skin: Not unusual dry    Assessment   Hypothyroidism, long-standing and subjectively doing better with Armour Thyroid Although she has been irregular with her thyroid supplement her TSH is still high, free T4 not low compared to last time   Treatment:  Will increase her dose by 90  mg a week of the Armour Thyroid, instructions to the patient given as follows: Take 1 pill daily and extra 1/2 on Sundays   [facility] 08/07/2013, 10:52 AM

## 2013-08-12 ENCOUNTER — Ambulatory Visit (HOSPITAL_BASED_OUTPATIENT_CLINIC_OR_DEPARTMENT_OTHER): Payer: BC Managed Care – PPO | Admitting: Hematology

## 2013-08-12 VITALS — BP 150/80 | HR 74 | Temp 98.1°F | Resp 20 | Ht 59.0 in | Wt 173.1 lb

## 2013-08-12 DIAGNOSIS — Z803 Family history of malignant neoplasm of breast: Secondary | ICD-10-CM

## 2013-08-12 DIAGNOSIS — N6019 Diffuse cystic mastopathy of unspecified breast: Secondary | ICD-10-CM

## 2013-08-12 DIAGNOSIS — N644 Mastodynia: Secondary | ICD-10-CM

## 2013-08-12 DIAGNOSIS — Z1239 Encounter for other screening for malignant neoplasm of breast: Secondary | ICD-10-CM

## 2013-08-13 ENCOUNTER — Other Ambulatory Visit: Payer: Self-pay | Admitting: Hematology

## 2013-08-13 DIAGNOSIS — N644 Mastodynia: Secondary | ICD-10-CM

## 2013-08-14 ENCOUNTER — Other Ambulatory Visit: Payer: Self-pay | Admitting: *Deleted

## 2013-08-15 ENCOUNTER — Other Ambulatory Visit: Payer: Self-pay | Admitting: Hematology

## 2013-08-15 ENCOUNTER — Telehealth: Payer: Self-pay | Admitting: Oncology

## 2013-08-15 DIAGNOSIS — N644 Mastodynia: Secondary | ICD-10-CM

## 2013-08-15 DIAGNOSIS — Z803 Family history of malignant neoplasm of breast: Secondary | ICD-10-CM

## 2013-08-15 NOTE — Telephone Encounter (Signed)
s.w. pt and advised on July mammo....GI advised that pt need to call to sched MRI for screening...pt aware

## 2013-08-16 ENCOUNTER — Encounter: Payer: Self-pay | Admitting: Hematology

## 2013-08-16 NOTE — Progress Notes (Signed)
[facility]cer Center CONSULT NOTE  Patient Care Team: Maurice Small, MD as PCP - General  CHIEF COMPLAINTS/PURPOSE OF CONSULTATION:   "I have something wrong in my breast"   HISTORY OF PRESENTING ILLNESS:   Lydia Dillon 48 y.o. female from [place] came to [facility] center high risk clinic because of feeling pain and lump in tenderness in both of her breasts. She has known hypothyroidism for 15 years.In March 2015, she was also seen by Dr Wynona Luna and complained then of having breast tenderness for 6 months. She complained of feeling some nodularity and increasing tenderness on lateral sides of both breasts. She never had mastitis or signs of infection, skin discoloration, nipple discharge, nipple inversion. Diagnostic Mammogram was performed on 02/20/2012 as well as 02/12/2013 which shows Dense breasts and presence of dense fibroglandular tissue throughout the upper outer quadrants bilaterally but no discrete mass, distortion or acoustic shadowing was seen on ultrasound (BIRADS category 1 exam).   When Dr Corliss Skains examined her, no mass was felt just fibrocystic tender breasts laterally, no palpable lymphadenopathy. Patient's mother passed away with stage 4 breast cancer. Patient is also nulliparous. His impression was patient has fibrocystic breasts and told her to limit caffeine which she still consumes quite a bit coffee daily, use heating pads or NSAIDs and referral to High-risk breast cancer clinic and consider MRI breasts if symptoms persist and because of underlying dense breast tissue.  Patient's age of menarche was 55, menopause age 39, she is nulliparous, she took birth control pills x 3 years, no use of estrogen replacement therapy, no previous breast biopsies, no exposure to fertility medications. According to Gail's model her 5-year risk of developing breast cancer is 2.1%(cf 1.3% in general population of her age) and her life time risk of developing breast cancer is 18.3% (cf  11.2 % for average women of her age)   She has a family history of mother who developed breast cancer at age 20 and died when she was 68. At the time she passed, she had breast cancer metastasis to bones, spine and brain and was placed on hospice care. She also had a paternal grandmother and a paternal aunt who were diagnosed with breast cancer. 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  Patient also have hx of cholelithiasis but she did not want surgery and just doing dietary modifications but occasionally gets a flare up of gall bladder symptoms.  I reviewed her records extensively and collaborated the history with the patient.    SUMMARY OF ONCOLOGIC HISTORY:  No history exists.    MEDICAL HISTORY:  Past Medical History  Diagnosis Date  . GERD (gastroesophageal reflux disease)   . Thyroid disease   . Fibrocystic breast disease   . Herpes simplex virus infection   Hypothyroidism managed by Dr Lucianne Muss.  SURGICAL HISTORY: Past Surgical History  Procedure Laterality Date  . Laparoscopy    Laparoscopy was in December of 1999  SOCIAL HISTORY: History   Social History  . Marital Status: Married    Spouse Name: N/A    Number of Children: N/A  . Years of Education: N/A   Occupational History  . Not on file.   Social History Main Topics  . Smoking status: Former Smoker    Quit date: 08/08/1998  .  Smokeless tobacco: Never Used  . Alcohol Use: 0.0 oz/week    1-2 Glasses of wine per week  . Drug Use: No  . Sexual Activity: Not on file   Other Topics Concern  . Not on file   Social History Narrative  . No narrative on file   Patient born in [place]. 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 Husband Lydia Dillon is a Programmer, multimediaconstruction superintendent and very involved in her care. They have one  daughter (not her biologic) Lydia Dillon who is 50 years old, very intelligent and studies in 5 th grade. Patient has no advanced directives.   FAMILY HISTORY: Family History  Problem Relation Age of Onset  . Cancer Mother     breast  . Hypertension Mother   . Hyperlipidemia Mother   . Heart disease Father   . Cancer Paternal Grandmother     breast  . Thyroid cancer Maternal Aunt   . Diabetes Neg Hx    FH listed also in HPI.  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 She did not have that problem with bananas in [place] but in  it has happened 2-3 times the itching in her throat, also happened with banana smoothie so she is careful about it.   MEDICATIONS:  Current Outpatient Prescriptions  Medication Sig Dispense Refill  . thyroid (ARMOUR) 90 MG tablet Take 1 tablet (90 mg total) by mouth daily.  30 tablet  5   No current facility-administered medications for this visit.    REVIEW OF SYSTEMS:   Constitutional: Denies fevers, chills or abnormal night sweats Eyes: Denies blurriness of vision, double vision or watery eyes Ears, nose, mouth, throat, and face: Denies mucositis or sore throat Respiratory: Denies cough, dyspnea or wheezes Cardiovascular: Denies palpitation, chest discomfort or lower extremity swelling Gastrointestinal:  Denies nausea, heartburn or change in bowel habits Skin: Denies abnormal skin rashes Lymphatics: Denies new lymphadenopathy or easy bruising Neurological:Denies numbness, tingling or new weaknesses Behavioral/Psych: Mood is stable, no new changes  Breasts: pain and tenderness in both breasts x 6-8 months, no nipple discharge. All other systems were reviewed with the patient and are negative.  PHYSICAL EXAMINATION: ECOG PERFORMANCE STATUS: 0 KPS: 100  Filed Vitals:   08/12/13 1119  BP: 150/80  Pulse: 74  Temp: 98.1 F (36.7 C)  Resp: 20   Filed Weights   08/12/13 1119  Weight: 173 lb 1.6 oz (78.518 kg)    GENERAL:alert, no distress  and comfortable SKIN: skin color, texture, turgor are normal, no rashes or significant lesions EYES: normal, conjunctiva are pink and non-injected, sclera clear OROPHARYNX:no exudate, no erythema and lips, buccal mucosa, and tongue normal  NECK: supple, thyroid normal size, non-tender, without nodularity LYMPH:  no palpable lymphadenopathy in the cervical, axillary or inguinal LUNGS: clear to auscultation and percussion with normal breathing effort HEART: regular rate & rhythm and no murmurs and no lower extremity edema ABDOMEN:abdomen soft, non-tender and normal bowel sounds Musculoskeletal:no cyanosis of digits and no clubbing  PSYCH: alert & oriented x 3 with fluent speech NEURO: no focal motor/sensory deficits BREAST: Dedicated exam was done in presence of nursing staff. No discrete or focal or dominant mass felt in either breast. She has Fibrocystic breasts and tenderness on lateral side, no skin changes, no nipple changes and no adenopathy in axillary, supraclavicular or cervical region.  LABORATORY DATA:   TSH: 7.6 08/04/13  TSH 6.42 05/02/13  CBC WBC 5.6 HB 12.8 HCT 38 PLATELETS 242  07/30/12  PAP 11/16/2009 negative for intraepithelial lesion or malignancy.  RADIOGRAPHIC STUDIES: I have personally reviewed the radiological image reports that's prior mammogram results from 2014 and 2015  ASSESSMENT/PLAN:   1. Ronnette Rump is a 48 years old [place] female who was referred to High risk breast clinic because of the symptoms of bilateral breast pain and tenderness which is likely from her Fibrocystic disease. She is concerned and anxious because her mother and 2 other relatives have died from Breast cancer and she is just not satisfied that mammogram and ultrasound have completely ruled out an underlying breast problem. She is asking if we can order a breast MRI to resolve this issue and put her mind to peace.  2. Clinically speaking, the risk of finding an occult lesion with  MRI  breast is extremely low in her case but with her dense breasts, positive family history and the recommendation from the surgeon, it is OK to order one MRI so that we can prove that her breasts are normal and her symptoms are more from fibrocystic dense breasts and not from a neoplastic etiology.  3. Based on the GAIL model and NCI Breast cancer risk assessment tool her 5 year risk of developing breast cancer is 5% and life time risk is 18.3% more than the general population which the 5 year risk is 1.3% and life time risk 11.2%.  4. I am setting up a mammogram/Ultrasound and Breast MRI to be done in next 2 weeks and will call her with the results. If all those results come back as negative, that will give lot of assurance to patient and her spouse and thereafter we can continue the yearly mammograms and breast exams.  5. I don't think she qualifies for chemoprevention strategies based on breast cancer risk stratification and the risk of them outweigh the benefits that is the prevention with Tamoxifen or aromatase inhibitors which is appropriate in some properly selected high-risk candidates.  6. Patient will call us if there are any questions or concerns. We will also notify her once we get the results of the breast imaging studies I am ordering.   Cay Schillings, MD Medical Hematologist/Oncologist [facility] Pager: [phone] Office No: [phone]

## 2013-08-22 ENCOUNTER — Ambulatory Visit
Admission: RE | Admit: 2013-08-22 | Discharge: 2013-08-22 | Disposition: A | Discharge status: Home / Self Care | Payer: BC Managed Care – PPO | Source: Ambulatory Visit | Attending: Hematology | Admitting: Hematology

## 2013-08-22 DIAGNOSIS — Z803 Family history of malignant neoplasm of breast: Secondary | ICD-10-CM

## 2013-08-22 DIAGNOSIS — N644 Mastodynia: Secondary | ICD-10-CM

## 2013-08-26 ENCOUNTER — Ambulatory Visit
Admission: RE | Admit: 2013-08-26 | Discharge: 2013-08-26 | Disposition: A | Discharge status: Home / Self Care | Payer: BC Managed Care – PPO | Source: Ambulatory Visit | Attending: Hematology | Admitting: Hematology

## 2013-08-26 DIAGNOSIS — Z1239 Encounter for other screening for malignant neoplasm of breast: Secondary | ICD-10-CM

## 2013-08-26 MED ORDER — GADOBENATE DIMEGLUMINE 529 MG/ML IV SOLN
15.0000 mL | Freq: Once | INTRAVENOUS | Status: AC | PRN
  Administered 2013-08-26: 15 mL via INTRAVENOUS

## 2013-08-29 ENCOUNTER — Telehealth: Payer: Self-pay | Admitting: *Deleted

## 2013-08-29 NOTE — Telephone Encounter (Signed)
This RN spoke with pt per her call requesting results of MRI breast done earlier this week.  This RN obtained results and reviewed them with pt - informed her negative results. Noted 2 lymph nodes that are showing but per MRI are showing as benign.  Pt verbalized understanding that MRI does not show any areas of concern for breast cancer.  This RN reviewed with pt area of concern in her outer breast on both sides " like a thickening"  She states she has " been feeling it a lot and it is sore " " so how do I know if there is a problem "  This RN discussed above with pt with inquiry about current monthly cycle which Lydia Dillon states she has not had a period for 2 years.  She does have her ovaries.  She is experiencing hot flashes " so I think I might be in menopause "  This RN suggested for pt to check breast 1x a month around the same time.  If areas change or other noted abnormality she can call for appropriate recommendations.  She needs to notice if areas are more tender when she drinks caffeine or has concentrated sugars.  Per discussion Lydia Dillon verbalized understanding of above plan as well as to call if she has further concerns/

## 2013-11-04 ENCOUNTER — Other Ambulatory Visit (INDEPENDENT_AMBULATORY_CARE_PROVIDER_SITE_OTHER): Payer: BC Managed Care – PPO

## 2013-11-04 DIAGNOSIS — E039 Hypothyroidism, unspecified: Secondary | ICD-10-CM

## 2013-11-04 LAB — TSH: TSH: 0.45 u[IU]/mL (ref 0.35–4.50)

## 2013-11-07 ENCOUNTER — Ambulatory Visit: Payer: BC Managed Care – PPO | Admitting: Endocrinology

## 2013-11-07 ENCOUNTER — Other Ambulatory Visit: Payer: BC Managed Care – PPO

## 2013-11-10 ENCOUNTER — Ambulatory Visit (INDEPENDENT_AMBULATORY_CARE_PROVIDER_SITE_OTHER): Payer: BC Managed Care – PPO | Admitting: Endocrinology

## 2013-11-10 ENCOUNTER — Encounter: Payer: Self-pay | Admitting: Endocrinology

## 2013-11-10 VITALS — BP 133/81 | HR 74 | Temp 98.0°F | Resp 14 | Ht 59.0 in | Wt 177.0 lb

## 2013-11-10 DIAGNOSIS — E063 Autoimmune thyroiditis: Secondary | ICD-10-CM | POA: Insufficient documentation

## 2013-11-10 DIAGNOSIS — E038 Other specified hypothyroidism: Secondary | ICD-10-CM

## 2013-11-10 NOTE — Patient Instructions (Signed)
Take 1 pill daily   

## 2013-11-10 NOTE — Progress Notes (Signed)
Patient ID: Lydia Dillon, female   DOB: [birth year], 48 y.o.   MRN: [id number]    Reason for Appointment:  Hypothyroidism, followup visit   History of Present Illness:   The hypothyroidism was first diagnosed about  15 yrs ago  She believes that her initial symptoms at diagnosis were fatigue, lethargy, brain fog and hair loss. She had been taking Synthroid for several years However because of symptoms of being tired and lethargic as well as difficulty concentrating she had requested  Armour Thyroid in 2014 and with this she has had significant improvement in her energy level Her Armour thyroid dose has been adjusted since her first prescription   Because of persistently high TSH levels in 4/15 and 7/15 her dose was adjusted and now she is taking 90 mg, one tablet daily She thinks initially after increasing the dose she had less fatigue More recently however she gets sleepy in the afternoons and occasionally has difficulty finding words. She also has gained a little weight  No hair loss or cold intolerance.    The patient is taking the thyroid supplement very regularly in the morning before breakfast.  Not taking any calcium or iron supplements with her Armour thyroid  Lab Results  Component Value Date   FREET4 0.70 08/04/2013   FREET4 0.44* 05/01/2013   TSH 0.45 11/04/2013   TSH 7.60* 08/04/2013   TSH 6.42* 05/01/2013       Medication List       This list is accurate as of: 11/10/13  3:42 PM.  Always use your most recent med list.               thyroid 90 MG tablet  Commonly known as:  ARMOUR  Take 1 tablet (90 mg total) by mouth daily.        Allergies:  Allergies  Allergen Reactions  . Banana Itching    Throat itches    Past Medical History  Diagnosis Date  . GERD (gastroesophageal reflux disease)   . Thyroid disease   . Fibrocystic breast disease   . Herpes simplex virus infection     Past Surgical History  Procedure Laterality Date  . Laparoscopy       Family History  Problem Relation Age of Onset  . Cancer Mother     breast  . Hypertension Mother   . Hyperlipidemia Mother   . Heart disease Father   . Cancer Paternal Grandmother     breast  . Thyroid cancer Maternal Aunt   . Diabetes Neg Hx     Social History:  reports that she quit smoking about 15 years ago. She has never used smokeless tobacco. She reports that she drinks alcohol. She reports that she does not use illicit drugs.  REVIEW Of SYSTEMS:  Has difficulty with weight loss  Wt Readings from Last 3 Encounters:  11/10/13 177 lb (80.287 kg)  08/12/13 173 lb 1.6 oz (78.518 kg)  08/07/13 173 lb 12.8 oz (78.835 kg)   She has periodic numbness in her fingertips especially right side and occasional tingling in her right forearm. Also gets some numbness of her hands while sleeping  No history of hypertension or diabetes   Examination:   BP 133/81  Pulse 74  Temp(Src) 98 F (36.7 C)  Resp 14  Ht 4\' 11"  (1.499 m)  Wt 177 lb (80.287 kg)  BMI 35.73 kg/m2  SpO2 95%  GENERAL APPEARANCE: Has generalized obesity; no puffiness of the face or hands  Thyroid not palpable         NEUROLOGIC EXAM:  biceps reflexes show normal relaxation Skin: Not unusual dry Tinel's sign negative    Assessment/ Treatment:  Hypothyroidism, long-standing and subjectively doing better with Armour Thyroid Her TSH is improved significantly with taking 90 mg daily of Armour Thyroid Will continue the same dose for now but need to followup in 6 months  She will discuss her insomnia and numbness of her fingertips with PCP    [facility],Shynice Sigel 11/10/2013, 3:42 PM

## 2013-11-12 ENCOUNTER — Other Ambulatory Visit: Payer: Self-pay | Admitting: Endocrinology

## 2014-05-04 ENCOUNTER — Other Ambulatory Visit (INDEPENDENT_AMBULATORY_CARE_PROVIDER_SITE_OTHER): Payer: BLUE CROSS/BLUE SHIELD

## 2014-05-04 DIAGNOSIS — E038 Other specified hypothyroidism: Secondary | ICD-10-CM | POA: Diagnosis not present

## 2014-05-04 DIAGNOSIS — E063 Autoimmune thyroiditis: Secondary | ICD-10-CM

## 2014-05-04 LAB — TSH: TSH: 3.28 u[IU]/mL (ref 0.35–4.50)

## 2014-05-11 ENCOUNTER — Ambulatory Visit (INDEPENDENT_AMBULATORY_CARE_PROVIDER_SITE_OTHER): Payer: BLUE CROSS/BLUE SHIELD | Admitting: Endocrinology

## 2014-05-11 ENCOUNTER — Encounter: Payer: Self-pay | Admitting: Endocrinology

## 2014-05-11 VITALS — BP 128/72 | HR 87 | Temp 98.0°F | Resp 14 | Ht 59.0 in | Wt 176.8 lb

## 2014-05-11 DIAGNOSIS — E038 Other specified hypothyroidism: Secondary | ICD-10-CM

## 2014-05-11 DIAGNOSIS — E063 Autoimmune thyroiditis: Secondary | ICD-10-CM

## 2014-05-11 NOTE — Progress Notes (Signed)
Patient ID: Lydia Dillon, female   DOB: 12-Jun-1965, 49 y.o.   MRN: [id number]    Reason for Appointment:  Hypothyroidism, followup visit   History of Present Illness:   The hypothyroidism was first diagnosed about  15 yrs ago  She believes that her initial symptoms at diagnosis were fatigue, lethargy, brain fog and hair loss. She had been taking Synthroid for several years However because of symptoms of being tired and lethargic as well as difficulty concentrating she had requested  Armour Thyroid in 2014 and with this she has had significant improvement in her energy level Her Armour thyroid dose has been adjusted since her first prescription   Because of persistently high TSH levels in 4/15 and 7/15 her dose was gradually increased and now she is taking 90 mg, one tablet daily She thinks initially after increasing the dose she had less fatigue  Although she gets sleepy in the afternoons this is mostly when she is sitting still and does not feel any fatigue otherwise  No hair loss or cold intolerance.    The patient is taking the thyroid supplement very regularly in the morning before breakfast.  Not taking any calcium or iron supplements with her Armour thyroid  Her TSH is again normal:  Lab Results  Component Value Date   FREET4 0.70 08/04/2013   FREET4 0.44* 05/01/2013   TSH 3.28 05/04/2014   TSH 0.45 11/04/2013   TSH 7.60* 08/04/2013       Medication List       This list is accurate as of: 05/11/14  3:52 PM.  Always use your most recent med list.               ARMOUR THYROID 90 MG tablet  Generic drug:  thyroid  TAKE ONE TABLET BY MOUTH ONCE DAILY        Allergies:  Allergies  Allergen Reactions  . Banana Itching    Throat itches    Past Medical History  Diagnosis Date  . GERD (gastroesophageal reflux disease)   . Thyroid disease   . Fibrocystic breast disease   . Herpes simplex virus infection     Past Surgical History  Procedure  Laterality Date  . Laparoscopy      Family History  Problem Relation Age of Onset  . Cancer Mother     breast  . Hypertension Mother   . Hyperlipidemia Mother   . Heart disease Father   . Cancer Paternal Grandmother     breast  . Thyroid cancer Maternal Aunt   . Diabetes Neg Hx     Social History:  reports that she quit smoking about 15 years ago. She has never used smokeless tobacco. She reports that she drinks alcohol. She reports that she does not use illicit drugs.  REVIEW Of SYSTEMS:  Has difficulty with weight loss, this is now stable  Wt Readings from Last 3 Encounters:  05/11/14 176 lb 12.8 oz (80.196 kg)  11/10/13 177 lb (80.287 kg)  08/12/13 173 lb 1.6 oz (78.518 kg)     No history of hypertension or diabetes   Examination:   BP 128/72 mmHg  Pulse 87  Temp(Src) 98 F (36.7 C)  Resp 14  Ht 4\' 11"  (1.499 m)  Wt 176 lb 12.8 oz (80.196 kg)  BMI 35.69 kg/m2  SpO2 97%  Has generalized obesity; no puffiness of the face or hands  Thyroid not palpable         NEUROLOGIC  EXAM:  biceps reflexes show normal relaxation Skin: Not unusual dry    Assessment/ Treatment:  Hypothyroidism, long-standing and subjectively doing better with Armour Thyroid compared to Synthroid Her TSH is stable in the normal range consistently now  with taking 90 mg daily of Armour Thyroid Will continue the same dose for now but need to followup in 6 months  She will call if she has any unusual fatigue  Syble Picco 05/11/2014, 3:52 PM

## 2014-05-27 ENCOUNTER — Other Ambulatory Visit: Payer: Self-pay | Admitting: Endocrinology

## 2014-07-31 ENCOUNTER — Other Ambulatory Visit: Payer: Self-pay

## 2014-07-31 DIAGNOSIS — Z9289 Personal history of other medical treatment: Secondary | ICD-10-CM

## 2014-08-10 ENCOUNTER — Ambulatory Visit: Payer: BLUE CROSS/BLUE SHIELD | Admitting: Endocrinology

## 2014-09-01 ENCOUNTER — Other Ambulatory Visit: Payer: Self-pay

## 2014-09-01 DIAGNOSIS — Z1231 Encounter for screening mammogram for malignant neoplasm of breast: Secondary | ICD-10-CM

## 2014-09-02 ENCOUNTER — Ambulatory Visit
Admission: RE | Admit: 2014-09-02 | Discharge: 2014-09-02 | Disposition: A | Discharge status: Home / Self Care | Payer: BLUE CROSS/BLUE SHIELD | Source: Ambulatory Visit

## 2014-09-02 DIAGNOSIS — Z1231 Encounter for screening mammogram for malignant neoplasm of breast: Secondary | ICD-10-CM

## 2014-09-08 ENCOUNTER — Other Ambulatory Visit: Payer: Self-pay | Admitting: Family Medicine

## 2014-09-08 DIAGNOSIS — R928 Other abnormal and inconclusive findings on diagnostic imaging of breast: Secondary | ICD-10-CM

## 2014-09-11 ENCOUNTER — Other Ambulatory Visit: Payer: BLUE CROSS/BLUE SHIELD

## 2014-09-11 ENCOUNTER — Ambulatory Visit
Admission: RE | Admit: 2014-09-11 | Discharge: 2014-09-11 | Disposition: A | Discharge status: Home / Self Care | Payer: BLUE CROSS/BLUE SHIELD | Source: Ambulatory Visit | Attending: Family Medicine | Admitting: Family Medicine

## 2014-09-11 DIAGNOSIS — R928 Other abnormal and inconclusive findings on diagnostic imaging of breast: Secondary | ICD-10-CM

## 2014-11-05 ENCOUNTER — Other Ambulatory Visit (INDEPENDENT_AMBULATORY_CARE_PROVIDER_SITE_OTHER): Payer: BLUE CROSS/BLUE SHIELD

## 2014-11-05 ENCOUNTER — Other Ambulatory Visit: Payer: BLUE CROSS/BLUE SHIELD

## 2014-11-05 DIAGNOSIS — E063 Autoimmune thyroiditis: Secondary | ICD-10-CM

## 2014-11-05 DIAGNOSIS — E038 Other specified hypothyroidism: Secondary | ICD-10-CM | POA: Diagnosis not present

## 2014-11-05 LAB — TSH: TSH: 0.98 u[IU]/mL (ref 0.35–4.50)

## 2014-11-05 LAB — T4, FREE: Free T4: 0.62 ng/dL (ref 0.60–1.60)

## 2014-11-11 ENCOUNTER — Encounter: Payer: Self-pay | Admitting: Endocrinology

## 2014-11-11 ENCOUNTER — Ambulatory Visit (INDEPENDENT_AMBULATORY_CARE_PROVIDER_SITE_OTHER): Payer: BLUE CROSS/BLUE SHIELD | Admitting: Endocrinology

## 2014-11-11 VITALS — BP 126/76 | HR 84 | Temp 98.5°F | Resp 14 | Ht 59.0 in | Wt 181.2 lb

## 2014-11-11 DIAGNOSIS — E038 Other specified hypothyroidism: Secondary | ICD-10-CM | POA: Diagnosis not present

## 2014-11-11 DIAGNOSIS — E063 Autoimmune thyroiditis: Secondary | ICD-10-CM

## 2014-11-11 NOTE — Progress Notes (Signed)
Patient ID: Lydia Dillon, female   DOB: 03-Dec-1965, 49 y.o.   MRN: [id number]    Reason for Appointment:  Hypothyroidism, followup visit   History of Present Illness:   The hypothyroidism was first diagnosed about  15 yrs ago  She believes that her initial symptoms at diagnosis were fatigue, lethargy, brain fog and hair loss. She had been taking Synthroid for several years  However because of symptoms of being tired and lethargic as well as difficulty concentrating she had requested  Armour Thyroid in 2014 and with this she has had significant improvement in her energy level Her Armour thyroid dose has been adjusted since her first prescription   When she had high TSH levels in 4/15 and 7/15 her dose was gradually increased and now she is taking 90 mg, one tablet daily After increasing the dose she had less fatigue Currently she does not have any unusual fatigue, she thinks most of her fatigue is when she is working long hours  No hair loss or cold intolerance.    The patient is taking the thyroid supplement very regularly in the morning before breakfast.  Not taking any calcium or iron supplements with her Armour thyroid  Her TSH is again normal:  Lab Results  Component Value Date   FREET4 0.62 11/05/2014   FREET4 0.70 08/04/2013   FREET4 0.44* 05/01/2013   TSH 0.98 11/05/2014   TSH 3.28 05/04/2014   TSH 0.45 11/04/2013       Medication List       This list is accurate as of: 11/11/14  4:47 PM.  Always use your most recent med list.               ARMOUR THYROID 90 MG tablet  Generic drug:  thyroid  TAKE ONE TABLET BY MOUTH ONCE DAILY        Allergies:  Allergies  Allergen Reactions  . Banana Itching    Throat itches    Past Medical History  Diagnosis Date  . GERD (gastroesophageal reflux disease)   . Thyroid disease   . Fibrocystic breast disease   . Herpes simplex virus infection     Past Surgical History  Procedure Laterality Date    . Laparoscopy      Family History  Problem Relation Age of Onset  . Cancer Mother     breast  . Hypertension Mother   . Hyperlipidemia Mother   . Heart disease Father   . Cancer Paternal Grandmother     breast  . Thyroid cancer Maternal Aunt   . Diabetes Neg Hx     Social History:  reports that she quit smoking about 16 years ago. She has never used smokeless tobacco. She reports that she drinks alcohol. She reports that she does not use illicit drugs.  REVIEW Of SYSTEMS:  Has difficulty with weight loss  Wt Readings from Last 3 Encounters:  11/11/14 181 lb 3.2 oz (82.192 kg)  05/11/14 176 lb 12.8 oz (80.196 kg)  11/10/13 177 lb (80.287 kg)       Examination:   BP 126/76 mmHg  Pulse 84  Temp(Src) 98.5 F (36.9 C)  Resp 14  Ht 4\' 11"  (1.499 m)  Wt 181 lb 3.2 oz (82.192 kg)  BMI 36.58 kg/m2  SpO2 97%  Thyroid not palpable         NEUROLOGIC EXAM:  biceps reflexes show normal relaxation Skin: Normal    Assessment/ Treatment:  Hypothyroidism, long-standing and subjectively  doing better with Armour Thyroid compared to Synthroid Her TSH is stable in the normal range consistently now  with taking 90 mg daily of Armour Thyroid Will continue the same dose for now, continue to followup in 6 months  She will call if she has any unusual fatigue  Luana Tatro 11/11/2014, 4:47 PM

## 2014-12-02 ENCOUNTER — Other Ambulatory Visit: Payer: Self-pay | Admitting: Endocrinology

## 2015-01-29 ENCOUNTER — Other Ambulatory Visit (HOSPITAL_COMMUNITY)
Admission: RE | Admit: 2015-01-29 | Discharge: 2015-01-29 | Disposition: A | Discharge status: Home / Self Care | Payer: BLUE CROSS/BLUE SHIELD | Source: Ambulatory Visit | Attending: Family Medicine | Admitting: Family Medicine

## 2015-01-29 ENCOUNTER — Other Ambulatory Visit: Payer: Self-pay | Admitting: Family Medicine

## 2015-01-29 DIAGNOSIS — Z124 Encounter for screening for malignant neoplasm of cervix: Secondary | ICD-10-CM | POA: Diagnosis present

## 2015-02-02 LAB — CYTOLOGY - PAP

## 2015-05-10 ENCOUNTER — Other Ambulatory Visit (INDEPENDENT_AMBULATORY_CARE_PROVIDER_SITE_OTHER): Payer: BLUE CROSS/BLUE SHIELD

## 2015-05-10 DIAGNOSIS — E038 Other specified hypothyroidism: Secondary | ICD-10-CM

## 2015-05-10 DIAGNOSIS — E063 Autoimmune thyroiditis: Secondary | ICD-10-CM

## 2015-05-10 LAB — TSH: TSH: 4.63 u[IU]/mL — ABNORMAL HIGH (ref 0.35–4.50)

## 2015-05-12 ENCOUNTER — Encounter: Payer: Self-pay | Admitting: Endocrinology

## 2015-05-12 ENCOUNTER — Ambulatory Visit (INDEPENDENT_AMBULATORY_CARE_PROVIDER_SITE_OTHER): Payer: BLUE CROSS/BLUE SHIELD | Admitting: Endocrinology

## 2015-05-12 VITALS — BP 124/82 | HR 78 | Temp 98.4°F | Resp 14 | Ht 59.0 in | Wt 178.5 lb

## 2015-05-12 DIAGNOSIS — E038 Other specified hypothyroidism: Secondary | ICD-10-CM

## 2015-05-12 DIAGNOSIS — E063 Autoimmune thyroiditis: Secondary | ICD-10-CM

## 2015-05-12 NOTE — Progress Notes (Signed)
Patient ID: Lydia Dillon, female   DOB: 02-16-1965, 50 y.o.   MRN: [id number]    Reason for Appointment:  Hypothyroidism, followup visit   History of Present Illness:   The hypothyroidism was first diagnosed about  15 yrs ago  She believes that her initial symptoms at diagnosis were fatigue, lethargy, brain fog and hair loss. She had been taking Synthroid for several years  However because of symptoms of being tired and lethargic as well as difficulty concentrating she had requested  Armour Thyroid in 2014 and with this she has had significant improvement in her energy level Her Armour thyroid dose has been adjusted since her first prescription   When she had high TSH levels in 4/15 and 7/15 her dose was gradually increased and now she is taking 90 mg, one tablet daily  Currently she does not have any unusual fatigue or difficulty focusing  No hair loss or cold intolerance.    The patient is taking the thyroid supplement very regularly in the morning before breakfast.  Not taking any calcium or iron supplements with her Armour thyroid  Her TSH is higher but she had not taken her Armour Thyroid tablet 2 days prior to her lab   Lab Results  Component Value Date   TSH 4.63* 05/10/2015   TSH 0.98 11/05/2014   TSH 3.28 05/04/2014   FREET4 0.62 11/05/2014   FREET4 0.70 08/04/2013   FREET4 0.44* 05/01/2013        Medication List       This list is accurate as of: 05/12/15  4:38 PM.  Always use your most recent med list.               ARMOUR THYROID 90 MG tablet  Generic drug:  thyroid  TAKE ONE TABLET BY MOUTH ONCE DAILY        Allergies:  Allergies  Allergen Reactions  . Banana Itching    Throat itches    Past Medical History  Diagnosis Date  . GERD (gastroesophageal reflux disease)   . Thyroid disease   . Fibrocystic breast disease   . Herpes simplex virus infection     Past Surgical History  Procedure Laterality Date  . Laparoscopy       Family History  Problem Relation Age of Onset  . Cancer Mother     breast  . Hypertension Mother   . Hyperlipidemia Mother   . Heart disease Father   . Cancer Paternal Grandmother     breast  . Thyroid cancer Maternal Aunt   . Diabetes Neg Hx     Social History:  reports that she quit smoking about 16 years ago. She has never used smokeless tobacco. She reports that she drinks alcohol. She reports that she does not use illicit drugs.  REVIEW Of SYSTEMS:  Has difficulty with weight loss  Wt Readings from Last 3 Encounters:  05/12/15 178 lb 8 oz (80.967 kg)  11/11/14 181 lb 3.2 oz (82.192 kg)  05/11/14 176 lb 12.8 oz (80.196 kg)       Examination:   BP 124/82 mmHg  Pulse 78  Temp(Src) 98.4 F (36.9 C)  Resp 14  Ht 4\' 11"  (1.499 m)  Wt 178 lb 8 oz (80.967 kg)  BMI 36.03 kg/m2  SpO2 95%  Thyroid not palpable        Her biceps reflexes show normal relaxation Skin: Normal    Assessment/ Treatment:  Hypothyroidism, long-standing and subjectively doing better with  Armour Thyroid compared to Synthroid She is subjectively doing well and has no new fatigue Although her TSH is 4.6 this is likely to be from her missing her thyroid pill 2 days prior to her lab testing  Will continue the same dose for now, continue to followup in 6 months   Diondre Pulis 05/12/2015, 4:38 PM

## 2015-06-08 ENCOUNTER — Other Ambulatory Visit: Payer: Self-pay | Admitting: Endocrinology

## 2015-08-25 ENCOUNTER — Other Ambulatory Visit: Payer: Self-pay | Admitting: Family Medicine

## 2015-08-25 DIAGNOSIS — Z1231 Encounter for screening mammogram for malignant neoplasm of breast: Secondary | ICD-10-CM

## 2015-09-03 ENCOUNTER — Ambulatory Visit
Admission: RE | Admit: 2015-09-03 | Discharge: 2015-09-03 | Disposition: A | Discharge status: Home / Self Care | Payer: BLUE CROSS/BLUE SHIELD | Source: Ambulatory Visit | Attending: Family Medicine | Admitting: Family Medicine

## 2015-09-03 DIAGNOSIS — Z1231 Encounter for screening mammogram for malignant neoplasm of breast: Secondary | ICD-10-CM

## 2015-09-07 ENCOUNTER — Other Ambulatory Visit: Payer: Self-pay | Admitting: Family Medicine

## 2015-09-07 DIAGNOSIS — R928 Other abnormal and inconclusive findings on diagnostic imaging of breast: Secondary | ICD-10-CM

## 2015-09-10 ENCOUNTER — Ambulatory Visit
Admission: RE | Admit: 2015-09-10 | Discharge: 2015-09-10 | Disposition: A | Discharge status: Home / Self Care | Payer: BLUE CROSS/BLUE SHIELD | Source: Ambulatory Visit | Attending: Family Medicine | Admitting: Family Medicine

## 2015-09-10 DIAGNOSIS — R928 Other abnormal and inconclusive findings on diagnostic imaging of breast: Secondary | ICD-10-CM

## 2015-11-08 ENCOUNTER — Other Ambulatory Visit (INDEPENDENT_AMBULATORY_CARE_PROVIDER_SITE_OTHER): Payer: BLUE CROSS/BLUE SHIELD

## 2015-11-08 DIAGNOSIS — E063 Autoimmune thyroiditis: Secondary | ICD-10-CM

## 2015-11-08 DIAGNOSIS — E038 Other specified hypothyroidism: Secondary | ICD-10-CM | POA: Diagnosis not present

## 2015-11-08 LAB — TSH: TSH: 2.49 u[IU]/mL (ref 0.35–4.50)

## 2015-11-08 LAB — T4, FREE: Free T4: 0.58 ng/dL — ABNORMAL LOW (ref 0.60–1.60)

## 2015-11-08 LAB — T3, FREE: T3, Free: 3.6 pg/mL (ref 2.3–4.2)

## 2015-11-11 ENCOUNTER — Ambulatory Visit: Payer: BLUE CROSS/BLUE SHIELD | Admitting: Endocrinology

## 2015-11-12 ENCOUNTER — Other Ambulatory Visit: Payer: Self-pay | Admitting: Endocrinology

## 2015-11-30 ENCOUNTER — Encounter: Payer: Self-pay | Admitting: Endocrinology

## 2015-11-30 ENCOUNTER — Ambulatory Visit (INDEPENDENT_AMBULATORY_CARE_PROVIDER_SITE_OTHER): Payer: BLUE CROSS/BLUE SHIELD | Admitting: Endocrinology

## 2015-11-30 VITALS — BP 130/80 | HR 72 | Ht 59.0 in | Wt 180.0 lb

## 2015-11-30 DIAGNOSIS — E063 Autoimmune thyroiditis: Secondary | ICD-10-CM

## 2015-11-30 DIAGNOSIS — E038 Other specified hypothyroidism: Secondary | ICD-10-CM | POA: Diagnosis not present

## 2015-11-30 NOTE — Progress Notes (Signed)
Patient ID: Lydia Dillon, female   DOB: Apr 25, 1965, 50 y.o.   MRN: [id number]    Reason for Appointment:  Hypothyroidism, followup visit   History of Present Illness:   The hypothyroidism was first diagnosed about  25 yrs ago  She believes that her initial symptoms at diagnosis were fatigue, lethargy, brain fog and hair loss. She had been taking Synthroid for several years  However because of symptoms of being tired and lethargic as well as difficulty concentrating she had requested  Armour Thyroid in 2014 and with this she has had significant improvement in her energy level Her Armour thyroid dose has been adjusted since her first prescription   When she had high TSH levels in 4/15 and 7/15 her dose was gradually increased and since then she is taking 90 mg, one tablet daily  Currently she does not have any unusual fatigue or difficulty focusing  No hair loss or cold intolerance.  The patient is taking the thyroid supplement very regularly in the morning before breakfast.  Not taking any calcium or iron supplements with her Armour thyroid  Her TSH is now back to normal again  Wt Readings from Last 3 Encounters:  11/30/15 180 lb (81.6 kg)  05/12/15 178 lb 8 oz (81 kg)  11/11/14 181 lb 3.2 oz (82.2 kg)    Lab Results  Component Value Date   TSH 2.49 11/08/2015   TSH 4.63 (H) 05/10/2015   TSH 0.98 11/05/2014   FREET4 0.58 (L) 11/08/2015   FREET4 0.62 11/05/2014   FREET4 0.70 08/04/2013        Medication List       Accurate as of 11/30/15  3:09 PM. Always use your most recent med list.          ARMOUR THYROID 90 MG tablet Generic drug:  thyroid TAKE ONE TABLET BY MOUTH ONCE DAILY       Allergies:  Allergies  Allergen Reactions  . Banana Itching    Throat itches    Past Medical History:  Diagnosis Date  . Fibrocystic breast disease   . GERD (gastroesophageal reflux disease)   . Herpes simplex virus infection   . Thyroid disease     Past  Surgical History:  Procedure Laterality Date  . LAPAROSCOPY      Family History  Problem Relation Age of Onset  . Cancer Mother     breast  . Hypertension Mother   . Hyperlipidemia Mother   . Heart disease Father   . Cancer Paternal Grandmother     breast  . Thyroid cancer Maternal Aunt   . Diabetes Neg Hx     Social History:  reports that she quit smoking about 17 years ago. She has never used smokeless tobacco. She reports that she drinks alcohol. She reports that she does not use drugs.  REVIEW Of SYSTEMS:  Has Had no significant change in weight  Wt Readings from Last 3 Encounters:  11/30/15 180 lb (81.6 kg)  05/12/15 178 lb 8 oz (81 kg)  11/11/14 181 lb 3.2 oz (82.2 kg)       Examination:   BP 130/80   Pulse 72   Ht 4\' 11"  (1.499 m)   Wt 180 lb (81.6 kg)   SpO2 95%   BMI 36.36 kg/m   Thyroid not palpable        Her biceps reflexes show normal relaxation Skin: Normal On extremities    Assessment/ Treatment:  Hypothyroidism, primary and long-standing  She has been subjectively doing better with Armour Thyroid compared to Synthroid Has been on the same dose consistently for some time She is very compliant with her medication  TSH is quite normal now  Will continue the same dose for now, will allow have her come back annually for follow-up on that she has recurrent symptoms   Deshauna Cayson 11/30/2015, 3:09 PM

## 2015-12-14 ENCOUNTER — Other Ambulatory Visit: Payer: Self-pay | Admitting: Endocrinology

## 2016-01-16 ENCOUNTER — Other Ambulatory Visit: Payer: Self-pay | Admitting: Endocrinology

## 2016-02-16 ENCOUNTER — Other Ambulatory Visit: Payer: Self-pay | Admitting: Endocrinology

## 2016-08-28 ENCOUNTER — Other Ambulatory Visit: Payer: Self-pay | Admitting: Family Medicine

## 2016-08-28 DIAGNOSIS — Z1231 Encounter for screening mammogram for malignant neoplasm of breast: Secondary | ICD-10-CM

## 2016-09-04 ENCOUNTER — Ambulatory Visit
Admission: RE | Admit: 2016-09-04 | Discharge: 2016-09-04 | Disposition: A | Discharge status: Home / Self Care | Payer: BLUE CROSS/BLUE SHIELD | Source: Ambulatory Visit | Attending: Family Medicine | Admitting: Family Medicine

## 2016-09-04 DIAGNOSIS — Z1231 Encounter for screening mammogram for malignant neoplasm of breast: Secondary | ICD-10-CM

## 2016-09-27 ENCOUNTER — Other Ambulatory Visit: Payer: Self-pay | Admitting: Endocrinology

## 2016-11-24 ENCOUNTER — Other Ambulatory Visit: Payer: BLUE CROSS/BLUE SHIELD

## 2016-11-29 ENCOUNTER — Ambulatory Visit: Payer: BLUE CROSS/BLUE SHIELD | Admitting: Endocrinology

## 2016-12-10 ENCOUNTER — Other Ambulatory Visit: Payer: Self-pay | Admitting: Endocrinology

## 2016-12-10 DIAGNOSIS — E063 Autoimmune thyroiditis: Secondary | ICD-10-CM

## 2016-12-13 ENCOUNTER — Other Ambulatory Visit (INDEPENDENT_AMBULATORY_CARE_PROVIDER_SITE_OTHER): Payer: BLUE CROSS/BLUE SHIELD

## 2016-12-13 DIAGNOSIS — E063 Autoimmune thyroiditis: Secondary | ICD-10-CM

## 2016-12-13 LAB — TSH: TSH: 2.78 u[IU]/mL (ref 0.35–4.50)

## 2016-12-13 LAB — T4, FREE: Free T4: 0.54 ng/dL — ABNORMAL LOW (ref 0.60–1.60)

## 2016-12-13 LAB — T3, FREE: T3, Free: 3 pg/mL (ref 2.3–4.2)

## 2016-12-18 ENCOUNTER — Encounter: Payer: Self-pay | Admitting: Endocrinology

## 2016-12-18 ENCOUNTER — Ambulatory Visit (INDEPENDENT_AMBULATORY_CARE_PROVIDER_SITE_OTHER): Payer: BLUE CROSS/BLUE SHIELD | Admitting: Endocrinology

## 2016-12-18 VITALS — BP 132/76 | HR 89 | Ht 58.5 in | Wt 180.2 lb

## 2016-12-18 DIAGNOSIS — E063 Autoimmune thyroiditis: Secondary | ICD-10-CM | POA: Diagnosis not present

## 2016-12-18 NOTE — Progress Notes (Signed)
Patient ID: Lydia Dillon, female   DOB: Apr 23, 1965, 51 y.o.   MRN: [id number]    Reason for Appointment:  Hypothyroidism, followup visit   History of Present Illness:   The hypothyroidism was first diagnosed about  25 yrs ago  She believes that her initial symptoms at diagnosis were fatigue, lethargy, brain fog and hair loss. She had been taking Synthroid for several years  However because of symptoms of being tired and lethargic as well as difficulty concentrating she had requested  Armour Thyroid in 2014 and with this she has had significant improvement in her energy level Her Armour thyroid dose has been adjusted since her first prescription   When she had high TSH levels in 4/15 and 7/15 her dose was gradually increased and since then she is taking 90 mg, one tablet daily The dose has been continued unchanged subsequently  Recently she does not have any unusual fatigue or brain fog  No hair loss or cold intolerance. Occasionally may have a little feeling of shakiness of palpitations but usually when she is stressed  The patient is taking the thyroid supplement very regularly in the morning before breakfast.   Not taking any calcium or iron supplements with her Armour thyroid  Her TSH is now consistently normal   Wt Readings from Last 3 Encounters:  12/18/16 180 lb 3.2 oz (81.7 kg)  11/30/15 180 lb (81.6 kg)  05/12/15 178 lb 8 oz (81 kg)    Lab Results  Component Value Date   TSH 2.78 12/13/2016   TSH 2.49 11/08/2015   TSH 4.63 (H) 05/10/2015   FREET4 0.54 (L) 12/13/2016   FREET4 0.58 (L) 11/08/2015   FREET4 0.62 11/05/2014      Allergies as of 12/18/2016      Reactions   Banana Itching   Throat itches      Medication List        Accurate as of 12/18/16  4:13 PM. Always use your most recent med list.          ARMOUR THYROID 90 MG tablet Generic drug:  thyroid TAKE ONE TABLET BY MOUTH ONCE DAILY       Allergies:  Allergies  Allergen  Reactions  . Banana Itching    Throat itches    Past Medical History:  Diagnosis Date  . Fibrocystic breast disease   . GERD (gastroesophageal reflux disease)   . Herpes simplex virus infection   . Thyroid disease     Past Surgical History:  Procedure Laterality Date  . LAPAROSCOPY      Family History  Problem Relation Age of Onset  . Cancer Mother        breast  . Hypertension Mother   . Hyperlipidemia Mother   . Breast cancer Mother   . Heart disease Father   . Cancer Paternal Grandmother        breast  . Breast cancer Paternal Grandmother   . Thyroid cancer Maternal Aunt   . Diabetes Neg Hx     Social History:  reports that she quit smoking about 18 years ago. she has never used smokeless tobacco. She reports that she drinks alcohol. She reports that she does not use drugs.  REVIEW Of SYSTEMS:  Has no significant change in weight since her last visit but this has fluctuated  Wt Readings from Last 3 Encounters:  12/18/16 180 lb 3.2 oz (81.7 kg)  11/30/15 180 lb (81.6 kg)  05/12/15 178 lb 8 oz (  81 kg)    She is having more stress events    Examination:   BP 132/76   Pulse 89   Ht 4' 10.5" (1.486 m)   Wt 180 lb 3.2 oz (81.7 kg)   SpO2 96%   BMI 37.02 kg/m   Thyroid not palpable        Her biceps reflexes show normal relaxation No peripheral edema    Assessment/ Treatment:  Hypothyroidism, primary and long-standing, without a goiter  She has been doing consistently well with taking Armour Thyroid compared to Synthroid Now with 90 mg daily her symptoms are well-controlled and she is quite compliant with medication every morning Her TSH has been consistently normal now   Will continue the same dose for now, will allow have her come back annually for follow-up unless she has new symptoms   Peja Allender 12/18/2016, 4:13 PM

## 2017-04-30 ENCOUNTER — Other Ambulatory Visit: Payer: Self-pay | Admitting: Endocrinology

## 2017-08-06 ENCOUNTER — Other Ambulatory Visit: Payer: Self-pay | Admitting: Family Medicine

## 2017-08-06 DIAGNOSIS — Z1231 Encounter for screening mammogram for malignant neoplasm of breast: Secondary | ICD-10-CM

## 2017-09-10 ENCOUNTER — Ambulatory Visit
Admission: RE | Admit: 2017-09-10 | Discharge: 2017-09-10 | Disposition: A | Discharge status: Home / Self Care | Payer: BLUE CROSS/BLUE SHIELD | Source: Ambulatory Visit | Attending: Family Medicine | Admitting: Family Medicine

## 2017-09-10 DIAGNOSIS — Z1231 Encounter for screening mammogram for malignant neoplasm of breast: Secondary | ICD-10-CM

## 2017-11-30 ENCOUNTER — Other Ambulatory Visit: Payer: Self-pay | Admitting: Endocrinology

## 2017-12-17 ENCOUNTER — Other Ambulatory Visit (INDEPENDENT_AMBULATORY_CARE_PROVIDER_SITE_OTHER): Payer: BLUE CROSS/BLUE SHIELD

## 2017-12-17 DIAGNOSIS — E063 Autoimmune thyroiditis: Secondary | ICD-10-CM

## 2017-12-17 LAB — T3, FREE: T3, Free: 3.6 pg/mL (ref 2.3–4.2)

## 2017-12-17 LAB — T4, FREE: Free T4: 0.61 ng/dL (ref 0.60–1.60)

## 2017-12-17 LAB — TSH: TSH: 7.99 u[IU]/mL — ABNORMAL HIGH (ref 0.35–4.50)

## 2017-12-19 ENCOUNTER — Ambulatory Visit (INDEPENDENT_AMBULATORY_CARE_PROVIDER_SITE_OTHER): Payer: BLUE CROSS/BLUE SHIELD | Admitting: Endocrinology

## 2017-12-19 ENCOUNTER — Encounter: Payer: Self-pay | Admitting: Endocrinology

## 2017-12-19 VITALS — BP 130/80 | HR 74 | Ht 58.5 in | Wt 186.2 lb

## 2017-12-19 DIAGNOSIS — E063 Autoimmune thyroiditis: Secondary | ICD-10-CM

## 2017-12-19 MED ORDER — THYROID 15 MG PO TABS: 15.0000 mg | ORAL_TABLET | Freq: Every day | ORAL | 1 refills | Status: DC

## 2017-12-19 NOTE — Progress Notes (Signed)
Patient ID: Lydia Dillon, female   DOB: 10-Sep-1965, 52 y.o.   MRN: [id number]    Reason for Appointment:  Hypothyroidism, followup visit   History of Present Illness:   The hypothyroidism was first diagnosed about  25 yrs ago  She believes that her initial symptoms at diagnosis were fatigue, lethargy, brain fog and hair loss. She had been taking Synthroid for several years  However because of symptoms of being tired and lethargic as well as difficulty concentrating she had requested  Armour Thyroid in 2014 and with this she has had significant improvement in her energy level Her Armour thyroid dose has been adjusted since her first prescription   Since 2015 has been taking 90 mg, one tablet daily The dose has been continued unchanged subsequently  She is back for her annual follow-up Although the last few months she has been feeling more sleepy and tired in the afternoons she thought this was related to her stress and not getting enough rest Also has gained some weight from lack of exercise Does not have any difficulty with memory or concentration  No hair loss or cold intolerance.   The patient is taking the thyroid supplement very regularly in the morning before breakfast.   Not taking any calcium or iron supplements with her Armour thyroid  Her TSH is now higher than usual at about 8 compared to 2.8   Wt Readings from Last 3 Encounters:  12/19/17 186 lb 3.2 oz (84.5 kg)  12/18/16 180 lb 3.2 oz (81.7 kg)  11/30/15 180 lb (81.6 kg)    Lab Results  Component Value Date   TSH 7.99 (H) 12/17/2017   TSH 2.78 12/13/2016   TSH 2.49 11/08/2015   FREET4 0.61 12/17/2017   FREET4 0.54 (L) 12/13/2016   FREET4 0.58 (L) 11/08/2015      Allergies as of 12/19/2017      Reactions   Banana Itching   Throat itches      Medication List        Accurate as of 12/19/17  4:14 PM. Always use your most recent med list.          ARMOUR THYROID 90 MG tablet Generic  drug:  thyroid TAKE 1 TABLET BY MOUTH ONCE DAILY       Allergies:  Allergies  Allergen Reactions  . Banana Itching    Throat itches    Past Medical History:  Diagnosis Date  . Fibrocystic breast disease   . GERD (gastroesophageal reflux disease)   . Herpes simplex virus infection   . Thyroid disease     Past Surgical History:  Procedure Laterality Date  . LAPAROSCOPY      Family History  Problem Relation Age of Onset  . Cancer Mother        breast  . Hypertension Mother   . Hyperlipidemia Mother   . Breast cancer Mother   . Heart disease Father   . Cancer Paternal Grandmother        breast  . Breast cancer Paternal Grandmother   . Thyroid cancer Maternal Aunt   . Diabetes Neg Hx     Social History:  reports that she quit smoking about 19 years ago. She has never used smokeless tobacco. She reports that she drinks about 1.0 - 2.0 standard drinks of alcohol per week. She reports that she does not use drugs.  REVIEW Of SYSTEMS:  No history of hypertension   Examination:   BP 130/80 (BP Location:  Left Arm, Patient Position: Sitting, Cuff Size: Normal)   Pulse 74   Ht 4' 10.5" (1.486 m)   Wt 186 lb 3.2 oz (84.5 kg)   SpO2 97%   BMI 38.25 kg/m   Thyroid not palpable        Her biceps reflexes show relatively slow relaxation Swelling of the eyes not present    Assessment/ Treatment:  Hypothyroidism, primary and long-standing, without a goiter  Although she had been on a very consistent dose of 90 mg Armour Thyroid her TSH is now significantly high This is with no change in her T4 or T3 levels  She does have afternoon somnolence and some weight gain as likely symptoms She has been consistent with taking her supplement regularly daily  She will need to increase her dosage can do this by adding another 15 mg Armour Thyroid tablet to her 90 mg dose Since she is currently doing a generic she can continue Needs to follow-up in 2 months again   Reather Littler 12/19/2017, 4:14 PM

## 2018-02-15 ENCOUNTER — Other Ambulatory Visit (INDEPENDENT_AMBULATORY_CARE_PROVIDER_SITE_OTHER): Payer: BLUE CROSS/BLUE SHIELD

## 2018-02-15 DIAGNOSIS — E063 Autoimmune thyroiditis: Secondary | ICD-10-CM

## 2018-02-15 LAB — TSH: TSH: 3.89 u[IU]/mL (ref 0.35–4.50)

## 2018-02-15 LAB — T3, FREE: T3, Free: 3.5 pg/mL (ref 2.3–4.2)

## 2018-02-18 ENCOUNTER — Other Ambulatory Visit: Payer: BLUE CROSS/BLUE SHIELD

## 2018-02-21 ENCOUNTER — Ambulatory Visit (INDEPENDENT_AMBULATORY_CARE_PROVIDER_SITE_OTHER): Payer: BLUE CROSS/BLUE SHIELD | Admitting: Endocrinology

## 2018-02-21 ENCOUNTER — Encounter: Payer: Self-pay | Admitting: Endocrinology

## 2018-02-21 VITALS — BP 118/68 | HR 86 | Ht 59.0 in | Wt 185.8 lb

## 2018-02-21 DIAGNOSIS — E063 Autoimmune thyroiditis: Secondary | ICD-10-CM | POA: Diagnosis not present

## 2018-02-21 MED ORDER — THYROID 15 MG PO TABS: 15.0000 mg | ORAL_TABLET | Freq: Every day | ORAL | 3 refills | Status: DC

## 2018-02-21 NOTE — Progress Notes (Signed)
Patient ID: Lydia Dillon, female   DOB: 07-Oct-1965, 53 y.o.   MRN: [id number]    Reason for Appointment:  Hypothyroidism, followup visit   History of Present Illness:   The hypothyroidism was first diagnosed about 1990  She believes that her initial symptoms at diagnosis were fatigue, lethargy, brain fog and hair loss. She had been taking Synthroid for several years  However because of symptoms of being tired and lethargic as well as difficulty concentrating she had requested  Armour Thyroid in 2014 and with this she has had significant improvement in her energy level Her Armour thyroid dose has been adjusted since her first prescription   Since 2015 had been taking 90 mg, one tablet daily CURRENT DOSAGE: 150 mg daily  She is back for her annual follow-up Her TSH was about 8.0 in 11/19 At that time she was having more sleepiness and fatigue and weight gain Was despite taking her thyroid supplement consistently every morning  With adding another 15 mg of Armour Thyroid she is having less consistent fatigue  Not taking any calcium or iron supplements with her Armour thyroid  Her TSH is now back to normal at 3.9 he thinks he feels fairly good now   Wt Readings from Last 3 Encounters:  02/21/18 185 lb 12.8 oz (84.3 kg)  12/19/17 186 lb 3.2 oz (84.5 kg)  12/18/16 180 lb 3.2 oz (81.7 kg)    Lab Results  Component Value Date   TSH 3.89 02/15/2018   TSH 7.99 (H) 12/17/2017   TSH 2.78 12/13/2016   FREET4 0.61 12/17/2017   FREET4 0.54 (L) 12/13/2016   FREET4 0.58 (L) 11/08/2015      Allergies as of 02/21/2018      Reactions   Banana Itching   Throat itches      Medication List       Accurate as of February 21, 2018  3:41 PM. Always use your most recent med list.        ARMOUR THYROID 90 MG tablet Generic drug:  thyroid TAKE 1 TABLET BY MOUTH ONCE DAILY   thyroid 15 MG tablet Commonly known as:  ARMOUR THYROID Take 1 tablet (15 mg total) by mouth  daily.       Allergies:  Allergies  Allergen Reactions  . Banana Itching    Throat itches    Past Medical History:  Diagnosis Date  . Fibrocystic breast disease   . GERD (gastroesophageal reflux disease)   . Herpes simplex virus infection   . Thyroid disease     Past Surgical History:  Procedure Laterality Date  . LAPAROSCOPY      Family History  Problem Relation Age of Onset  . Cancer Mother        breast  . Hypertension Mother   . Hyperlipidemia Mother   . Breast cancer Mother   . Heart disease Father   . Cancer Paternal Grandmother        breast  . Breast cancer Paternal Grandmother   . Thyroid cancer Maternal Aunt   . Diabetes Neg Hx     Social History:  reports that she quit smoking about 19 years ago. She has never used smokeless tobacco. She reports current alcohol use of about 1.0 - 2.0 standard drinks of alcohol per week. She reports that she does not use drugs.  REVIEW Of SYSTEMS:  No history of hypertension   Examination:   BP 118/68 (BP Location: Left Arm, Patient Position: Sitting, Cuff  Size: Normal)   Pulse 86   Ht 4\' 11"  (1.499 m)   Wt 185 lb 12.8 oz (84.3 kg)   LMP  (Exact Date)   SpO2 96%   BMI 37.53 kg/m   Thyroid not palpable        Her biceps reflexes show normal relaxation No hair loss evident    Assessment/ Treatment:  Hypothyroidism, primary and long-standing, without a goiter  She has had some progression of her hypothyroidism more recently and now taking 115 mg total of Armour Thyroid With this is subjectively better No thyroid enlargement She appears euthyroid on exam  TSH is back to normal Since she is feeling subjectively fairly good she will continue the same regimen  Follow-up in 4 months to make sure her thyroid is stable   Reather Littler 02/21/2018, 3:41 PM

## 2018-02-22 ENCOUNTER — Encounter: Payer: Self-pay | Admitting: Endocrinology

## 2018-06-18 ENCOUNTER — Other Ambulatory Visit (INDEPENDENT_AMBULATORY_CARE_PROVIDER_SITE_OTHER): Payer: BLUE CROSS/BLUE SHIELD

## 2018-06-18 ENCOUNTER — Other Ambulatory Visit: Payer: Self-pay

## 2018-06-18 DIAGNOSIS — E063 Autoimmune thyroiditis: Secondary | ICD-10-CM | POA: Diagnosis not present

## 2018-06-18 LAB — TSH: TSH: 5.48 u[IU]/mL — ABNORMAL HIGH (ref 0.35–4.50)

## 2018-06-18 LAB — T4, FREE: Free T4: 0.62 ng/dL (ref 0.60–1.60)

## 2018-06-18 LAB — T3, FREE: T3, Free: 3.2 pg/mL (ref 2.3–4.2)

## 2018-06-19 ENCOUNTER — Telehealth: Payer: Self-pay | Admitting: Endocrinology

## 2018-06-19 NOTE — Telephone Encounter (Signed)
Noted. Will attempt to call pt after 11am.

## 2018-06-19 NOTE — Telephone Encounter (Signed)
Patient is returning call in regards to appt tomorrow, states they will be available after 11.

## 2018-06-20 ENCOUNTER — Other Ambulatory Visit: Payer: Self-pay | Admitting: Endocrinology

## 2018-06-20 ENCOUNTER — Other Ambulatory Visit: Payer: Self-pay

## 2018-06-20 ENCOUNTER — Ambulatory Visit (INDEPENDENT_AMBULATORY_CARE_PROVIDER_SITE_OTHER): Payer: BLUE CROSS/BLUE SHIELD | Admitting: Endocrinology

## 2018-06-20 ENCOUNTER — Encounter: Payer: Self-pay | Admitting: Endocrinology

## 2018-06-20 DIAGNOSIS — E063 Autoimmune thyroiditis: Secondary | ICD-10-CM

## 2018-06-20 MED ORDER — THYROID 15 MG PO TABS: ORAL_TABLET | ORAL | 3 refills | Status: DC

## 2018-06-20 NOTE — Progress Notes (Addendum)
Patient ID: Lydia Dillon, female   DOB: Apr 14, 1965, 53 y.o.   MRN: [id number]   Today's office visit was provided via telemedicine using video technique Explained to the patient and the the limitations of evaluation and management by telemedicine and the availability of in person appointments.  The patient understood the limitations and agreed to proceed. Patient also understood that the telehealth visit is billable. . Location of the patient: Home . Location of the provider: Office Only the patient and myself were participating in the encounter     Reason for Appointment:  Hypothyroidism, followup visit   History of Present Illness:   The hypothyroidism was first diagnosed about 1990  She believes that her initial symptoms at diagnosis were fatigue, lethargy, brain fog and hair loss. She had been taking Synthroid for several years  However because of symptoms of being tired and lethargic as well as difficulty concentrating she had requested  Armour Thyroid in 2014 and with this she has had significant improvement in her energy level Her Armour thyroid dose has been adjusted since her first prescription   Since 2015 had been taking 90 mg, one tablet daily  CURRENT DOSAGE:    Her last dosage change was in 11/2017 when TSH was about 8.0 At that time she was having more sleepiness and fatigue and weight gain Was despite taking her thyroid supplement consistently every morning  With adding another 15 mg of Armour Thyroid she was having less consistent fatigue  However in the last few weeks she has been feeling sleepy on and off in the afternoon but no consistent fatigue She thinks she has lost 3 pounds Also about 3 weeks ago she was having some palpitations  She is very consistently taking her Armour Thyroid tablets right on waking up with water  Not taking any calcium or iron supplements with her Armour thyroid  Although her TSH was improved at 3.9 it is now back  up to 5.5 without any change in dosage   Wt Readings from Last 3 Encounters:  02/21/18 185 lb 12.8 oz (84.3 kg)  12/19/17 186 lb 3.2 oz (84.5 kg)  12/18/16 180 lb 3.2 oz (81.7 kg)    Lab Results  Component Value Date   TSH 5.48 (H) 06/18/2018   TSH 3.89 02/15/2018   TSH 7.99 (H) 12/17/2017   FREET4 0.62 06/18/2018   FREET4 0.61 12/17/2017   FREET4 0.54 (L) 12/13/2016      Allergies as of 06/20/2018      Reactions   Banana Itching   Throat itches      Medication List       Accurate as of Jun 20, 2018  3:18 PM. If you have any questions, ask your nurse or doctor.        Armour Thyroid 90 MG tablet Generic drug:  thyroid TAKE 1 TABLET BY MOUTH ONCE DAILY   thyroid 15 MG tablet Commonly known as:  Armour Thyroid Take 1 tablet (15 mg total) by mouth daily.       Allergies:  Allergies  Allergen Reactions  . Banana Itching    Throat itches    Past Medical History:  Diagnosis Date  . Fibrocystic breast disease   . GERD (gastroesophageal reflux disease)   . Herpes simplex virus infection   . Thyroid disease     Past Surgical History:  Procedure Laterality Date  . LAPAROSCOPY      Family History  Problem Relation Age of Onset  .  Cancer Mother        breast  . Hypertension Mother   . Hyperlipidemia Mother   . Breast cancer Mother   . Heart disease Father   . Cancer Paternal Grandmother        breast  . Breast cancer Paternal Grandmother   . Thyroid cancer Maternal Aunt   . Diabetes Neg Hx     Social History:  reports that she quit smoking about 19 years ago. She has never used smokeless tobacco. She reports current alcohol use of about 1.0 - 2.0 standard drinks of alcohol per week. She reports that she does not use drugs.  REVIEW Of SYSTEMS:  No history of hypertension   Examination:   There were no vitals taken for this visit. Patient is remote      Assessment/ Treatment:  Hypothyroidism, primary and long-standing, without history of  goiter on previous exams  She has had gradual progression of her hypothyroidism She has been on Armour Thyroid instead of levothyroxine for at least 6 years now, this helps her significantly feel better with her original symptoms of feeling lethargic and not concentrating well  Even with taking 115 mg of Armour Thyroid which she has been taking very regularly her TSH is going up above normal She has some nonspecific fatigue/somnolence which is not consistent and not clear if this is related to her hypothyroidism  Since her free T4 and T3 levels are about the same as before likely will need a higher dose of Armour Thyroid  She will take an extra 15 mg Armour Thyroid every other day in addition to her regular regimen She will need to make a note of her regimen and be regular with this  Follow-up 6 weeks for labs again and if stable she can come back for follow-up in November   Reather LittlerAjay Daneesha Quinteros 06/20/2018, 3:18 PM

## 2018-07-06 ENCOUNTER — Other Ambulatory Visit: Payer: Self-pay | Admitting: Endocrinology

## 2018-07-08 ENCOUNTER — Other Ambulatory Visit: Payer: Self-pay

## 2018-07-08 MED ORDER — ARMOUR THYROID 90 MG PO TABS: 90.0000 mg | ORAL_TABLET | Freq: Every day | ORAL | 2 refills | Status: DC

## 2018-07-30 ENCOUNTER — Other Ambulatory Visit: Payer: BLUE CROSS/BLUE SHIELD

## 2018-07-31 ENCOUNTER — Other Ambulatory Visit (INDEPENDENT_AMBULATORY_CARE_PROVIDER_SITE_OTHER): Payer: BC Managed Care – PPO

## 2018-07-31 ENCOUNTER — Other Ambulatory Visit: Payer: Self-pay

## 2018-07-31 DIAGNOSIS — E063 Autoimmune thyroiditis: Secondary | ICD-10-CM

## 2018-07-31 LAB — TSH: TSH: 1.03 u[IU]/mL (ref 0.35–4.50)

## 2018-07-31 LAB — T3, FREE: T3, Free: 4.3 pg/mL — ABNORMAL HIGH (ref 2.3–4.2)

## 2018-07-31 LAB — T4, FREE: Free T4: 0.66 ng/dL (ref 0.60–1.60)

## 2018-08-01 NOTE — Progress Notes (Signed)
Patient ID: Lydia Dillon, female   DOB: 1965/02/14, 53 y.o.   MRN: [id number]   Today's office visit was provided via telemedicine using video technique Explained to the patient and the the limitations of evaluation and management by telemedicine and the availability of in person appointments.  The patient understood the limitations and agreed to proceed. Patient also understood that the telehealth visit is billable. . Location of the patient: Home . Location of the provider: Office Only the patient and myself were participating in the encounter    Reason for Appointment:  Hypothyroidism, followup visit   History of Present Illness:   The hypothyroidism was first diagnosed about 1990  She believes that her initial symptoms at diagnosis were fatigue, lethargy, brain fog and hair loss. She had been taking Synthroid for several years  However because of symptoms of being tired and lethargic as well as difficulty concentrating she had requested  Armour Thyroid in 2014 and with this she has had significant improvement in her energy level Her Armour thyroid dose has been adjusted since her first prescription   Since 2015 had been taking 90 mg, one tablet daily  CURRENT DOSAGE:   Her last dosage change was in 05/2018 when TSH had started going up again and was 5.5 She was again complaining of feeling sleepy on and off but no weight change Also occasionally did have palpitations  Since then she is not taking a regimen of 90 mg of Armour Thyroid daily along with additional 15 mg alternating with 30 mg of Armour Thyroid  She did not complain of any fatigue Also palpitations are infrequent; at the same time she does not feel any shakiness or jitteriness She thinks her weight is about the same Has no difficulty remembering to take her medications without food in the morning daily  Not taking any calcium or iron supplements with her Armour thyroid  Now her TSH is improved at  1.03 However her free T3 level is high at 4.3   Wt Readings from Last 3 Encounters:  02/21/18 185 lb 12.8 oz (84.3 kg)  12/19/17 186 lb 3.2 oz (84.5 kg)  12/18/16 180 lb 3.2 oz (81.7 kg)    Lab Results  Component Value Date   TSH 1.03 07/31/2018   TSH 5.48 (H) 06/18/2018   TSH 3.89 02/15/2018   FREET4 0.66 07/31/2018   FREET4 0.62 06/18/2018   FREET4 0.61 12/17/2017    Lab Results  Component Value Date   T3FREE 4.3 (H) 07/31/2018   T3FREE 3.2 06/18/2018   T3FREE 3.5 02/15/2018     Allergies as of 08/02/2018      Reactions   Banana Itching   Throat itches      Medication List       Accurate as of August 01, 2018  9:36 PM. If you have any questions, ask your nurse or doctor.        thyroid 15 MG tablet Commonly known as: Armour Thyroid Take 1 tablet alternating with 2 tablets before breakfast daily   Armour Thyroid 90 MG tablet Generic drug: thyroid Take 1 tablet (90 mg total) by mouth daily.       Allergies:  Allergies  Allergen Reactions  . Banana Itching    Throat itches    Past Medical History:  Diagnosis Date  . Fibrocystic breast disease   . GERD (gastroesophageal reflux disease)   . Herpes simplex virus infection   . Thyroid disease     Past  Surgical History:  Procedure Laterality Date  . LAPAROSCOPY      Family History  Problem Relation Age of Onset  . Cancer Mother        breast  . Hypertension Mother   . Hyperlipidemia Mother   . Breast cancer Mother   . Heart disease Father   . Cancer Paternal Grandmother        breast  . Breast cancer Paternal Grandmother   . Thyroid cancer Maternal Aunt   . Diabetes Neg Hx     Social History:  reports that she quit smoking about 19 years ago. She has never used smokeless tobacco. She reports current alcohol use of about 1.0 - 2.0 standard drinks of alcohol per week. She reports that she does not use drugs.  REVIEW Of SYSTEMS:  No history of hypertension   Examination:   There were  no vitals taken for this visit.  She looks well on her video picture     Assessment/ Treatment:  Hypothyroidism, primary and long-standing, without history of goiter on previous exams  She has had gradual progression of her hypothyroidism and is taking relatively large doses of Armour Thyroid supplements  She has been on Armour Thyroid 115 mg alternating with 130 since her last visit She is not complaining of any fatigue at this time compared to her last visit Also no symptoms suggestive of over replacement However even though her TSH is excellent at 1.0 her T3 level is above normal  Since her additional Armour Thyroid is giving her excess liothyronine we will have her change the 50 mg Armour Thyroid tablets to LEVOTHYROXINE 25 mcg daily which she will combine with 90 mg of Armour Thyroid empirically  Follow-up in 4 months  Lydia Dillon 08/01/2018, 9:36 PM

## 2018-08-02 ENCOUNTER — Encounter: Payer: Self-pay | Admitting: Endocrinology

## 2018-08-02 ENCOUNTER — Ambulatory Visit (INDEPENDENT_AMBULATORY_CARE_PROVIDER_SITE_OTHER): Payer: BC Managed Care – PPO | Admitting: Endocrinology

## 2018-08-02 ENCOUNTER — Other Ambulatory Visit: Payer: Self-pay

## 2018-08-02 DIAGNOSIS — E063 Autoimmune thyroiditis: Secondary | ICD-10-CM | POA: Diagnosis not present

## 2018-08-02 MED ORDER — LEVOTHYROXINE SODIUM 25 MCG PO TABS: 25.0000 ug | ORAL_TABLET | Freq: Every day | ORAL | 3 refills | Status: DC

## 2018-08-03 NOTE — Addendum Note (Signed)
Addended by: Elayne Snare on: 08/03/2018 05:06 PM   Modules accepted: Orders

## 2018-09-13 ENCOUNTER — Other Ambulatory Visit: Payer: Self-pay | Admitting: Family Medicine

## 2018-09-13 DIAGNOSIS — Z1231 Encounter for screening mammogram for malignant neoplasm of breast: Secondary | ICD-10-CM

## 2018-10-09 ENCOUNTER — Other Ambulatory Visit: Payer: Self-pay | Admitting: Endocrinology

## 2018-10-10 ENCOUNTER — Other Ambulatory Visit: Payer: BC Managed Care – PPO

## 2018-10-30 ENCOUNTER — Other Ambulatory Visit: Payer: Self-pay

## 2018-10-30 ENCOUNTER — Ambulatory Visit
Admission: RE | Admit: 2018-10-30 | Discharge: 2018-10-30 | Disposition: A | Discharge status: Home / Self Care | Payer: BC Managed Care – PPO | Source: Ambulatory Visit | Attending: Family Medicine | Admitting: Family Medicine

## 2018-10-30 DIAGNOSIS — Z1231 Encounter for screening mammogram for malignant neoplasm of breast: Secondary | ICD-10-CM

## 2018-10-31 ENCOUNTER — Other Ambulatory Visit: Payer: Self-pay | Admitting: Family Medicine

## 2018-10-31 DIAGNOSIS — R928 Other abnormal and inconclusive findings on diagnostic imaging of breast: Secondary | ICD-10-CM

## 2018-11-05 ENCOUNTER — Other Ambulatory Visit: Payer: Self-pay

## 2018-11-05 ENCOUNTER — Ambulatory Visit: Payer: BC Managed Care – PPO

## 2018-11-05 ENCOUNTER — Ambulatory Visit
Admission: RE | Admit: 2018-11-05 | Discharge: 2018-11-05 | Disposition: A | Discharge status: Home / Self Care | Payer: BC Managed Care – PPO | Source: Ambulatory Visit | Attending: Family Medicine | Admitting: Family Medicine

## 2018-11-05 DIAGNOSIS — R928 Other abnormal and inconclusive findings on diagnostic imaging of breast: Secondary | ICD-10-CM

## 2018-12-05 ENCOUNTER — Other Ambulatory Visit: Payer: Self-pay

## 2018-12-05 ENCOUNTER — Other Ambulatory Visit (INDEPENDENT_AMBULATORY_CARE_PROVIDER_SITE_OTHER): Payer: BLUE CROSS/BLUE SHIELD

## 2018-12-05 DIAGNOSIS — E063 Autoimmune thyroiditis: Secondary | ICD-10-CM

## 2018-12-05 LAB — T4, FREE: Free T4: 0.77 ng/dL (ref 0.60–1.60)

## 2018-12-05 LAB — T3, FREE: T3, Free: 4 pg/mL (ref 2.3–4.2)

## 2018-12-05 LAB — TSH: TSH: 0.63 u[IU]/mL (ref 0.35–4.50)

## 2018-12-09 ENCOUNTER — Other Ambulatory Visit: Payer: Self-pay

## 2018-12-09 ENCOUNTER — Ambulatory Visit (INDEPENDENT_AMBULATORY_CARE_PROVIDER_SITE_OTHER): Payer: BC Managed Care – PPO | Admitting: Endocrinology

## 2018-12-09 ENCOUNTER — Encounter: Payer: Self-pay | Admitting: Endocrinology

## 2018-12-09 DIAGNOSIS — E063 Autoimmune thyroiditis: Secondary | ICD-10-CM

## 2018-12-09 NOTE — Progress Notes (Signed)
Patient ID: Lydia Dillon, female   DOB: 1966/01/17, 52 y.o.   MRN: [id number]   Today's office visit was provided via telemedicine using video technique Explained to the patient and the the limitations of evaluation and management by telemedicine and the availability of in person appointments.  The patient understood the limitations and agreed to proceed. Patient also understood that the telehealth visit is billable. . Location of the patient: Home . Location of the provider: Office Only the patient and myself were participating in the encounter    Reason for Appointment:  Hypothyroidism, followup visit   History of Present Illness:   The hypothyroidism was first diagnosed about 1990  She believes that her initial symptoms at diagnosis were fatigue, lethargy, brain fog and hair loss. She had been taking Synthroid for several years  However because of symptoms of being tired and lethargic as well as difficulty concentrating she had requested  Armour Thyroid in 2014 and with this she has had significant improvement in her energy level Her Armour thyroid dose has been adjusted since her first prescription   Since 2015 had been taking 90 mg, one tablet daily  CURRENT DOSAGE:   Her supplements were increased in 05/2018 when TSH had started going up again and was 5.5 She was complaining of feeling sleepy on and off but no weight change Also occasionally did have palpitations  Since 7/20 he is on a regimen of 90 mg of Armour Thyroid daily along with additional 25 mcg of levothyroxine This was changed because of a high T3 level and low normal free T4 on her combination  She did not feel any different with this Continues to have occasional palpitations but this is mostly a feeling of fast pulse when she is lying down Has not checked her pulse at that time  No complaints of fatigue, overall feels good She may have lost 6 pounds with better diet  She is taking her  supplements consistently in the morning every day  Not taking any calcium or iron supplements with her Armour thyroid  Now her TSH is slightly lower at 0.63, was at 1.03 However her free T3 level is still high normal at 4.0 Free T4 slightly improved   Wt Readings from Last 3 Encounters:  02/21/18 185 lb 12.8 oz (84.3 kg)  12/19/17 186 lb 3.2 oz (84.5 kg)  12/18/16 180 lb 3.2 oz (81.7 kg)    Lab Results  Component Value Date   TSH 0.63 12/05/2018   TSH 1.03 07/31/2018   TSH 5.48 (H) 06/18/2018   FREET4 0.77 12/05/2018   FREET4 0.66 07/31/2018   FREET4 0.62 06/18/2018    Lab Results  Component Value Date   T3FREE 4.0 12/05/2018   T3FREE 4.3 (H) 07/31/2018   T3FREE 3.2 06/18/2018     Allergies as of 12/09/2018      Reactions   Banana Itching   Throat itches      Medication List       Accurate as of December 09, 2018  1:16 PM. If you have any questions, ask your nurse or doctor.        Armour Thyroid 90 MG tablet Generic drug: thyroid Take 1 tablet by mouth once daily   levothyroxine 25 MCG tablet Commonly known as: Synthroid Take 1 tablet (25 mcg total) by mouth daily before breakfast. Take 1 tablet with 90 mg of Armour Thyroid.  Stop 15 mg Armour Thyroid tablets  Allergies:  Allergies  Allergen Reactions  . Banana Itching    Throat itches    Past Medical History:  Diagnosis Date  . Fibrocystic breast disease   . GERD (gastroesophageal reflux disease)   . Herpes simplex virus infection   . Thyroid disease     Past Surgical History:  Procedure Laterality Date  . LAPAROSCOPY      Family History  Problem Relation Age of Onset  . Cancer Mother        breast  . Hypertension Mother   . Hyperlipidemia Mother   . Breast cancer Mother   . Heart disease Father   . Cancer Paternal Grandmother        breast  . Breast cancer Paternal Grandmother   . Thyroid cancer Maternal Aunt   . Diabetes Neg Hx     Social History:  reports that she  quit smoking about 20 years ago. She has never used smokeless tobacco. She reports current alcohol use of about 1.0 - 2.0 standard drinks of alcohol per week. She reports that she does not use drugs.  REVIEW Of SYSTEMS:  No history of hypertension   Examination:   LMP 01/27/2011        Assessment/ Treatment:  Hypothyroidism, primary and long-standing, without history of goiter on previous exams  She has been on Armour Thyroid 90 mcg along with 25 mcg of levothyroxine  Her doses were rearranged because of high T3 on taking extra Armour Thyroid since 5/20  Although she does feel her heart racing at night this is not yet improved She otherwise feels fairly good with her energy level She thinks she is losing weight with better diet  Currently her thyroid levels are back in the normal range although T3 is still upper normal and TSH slightly lower at 0.68  She will continue the same combination except 1/2 tablet less per week on the Armour Thyroid  Follow-up in 4 months again  Reather Littler 12/09/2018, 1:16 PM

## 2019-02-09 ENCOUNTER — Other Ambulatory Visit: Payer: Self-pay | Admitting: Endocrinology

## 2019-04-08 ENCOUNTER — Other Ambulatory Visit: Payer: Self-pay

## 2019-04-08 ENCOUNTER — Other Ambulatory Visit (INDEPENDENT_AMBULATORY_CARE_PROVIDER_SITE_OTHER): Payer: BC Managed Care – PPO

## 2019-04-08 DIAGNOSIS — E063 Autoimmune thyroiditis: Secondary | ICD-10-CM

## 2019-04-08 LAB — T4, FREE: Free T4: 0.72 ng/dL (ref 0.60–1.60)

## 2019-04-08 LAB — T3, FREE: T3, Free: 3.6 pg/mL (ref 2.3–4.2)

## 2019-04-08 LAB — TSH: TSH: 1.98 u[IU]/mL (ref 0.35–4.50)

## 2019-04-14 ENCOUNTER — Ambulatory Visit: Payer: BC Managed Care – PPO | Admitting: Endocrinology

## 2019-04-14 ENCOUNTER — Ambulatory Visit (INDEPENDENT_AMBULATORY_CARE_PROVIDER_SITE_OTHER): Payer: BC Managed Care – PPO | Admitting: Endocrinology

## 2019-04-14 ENCOUNTER — Encounter: Payer: Self-pay | Admitting: Endocrinology

## 2019-04-14 ENCOUNTER — Other Ambulatory Visit: Payer: Self-pay

## 2019-04-14 VITALS — BP 138/70 | HR 75 | Ht 59.0 in | Wt 182.0 lb

## 2019-04-14 DIAGNOSIS — E063 Autoimmune thyroiditis: Secondary | ICD-10-CM

## 2019-04-14 NOTE — Progress Notes (Deleted)
Patient ID: Lydia Dillon, female   DOB: [birth year], 54 y.o.   MRN: [id number]   Today's office visit was provided via telemedicine using video technique Explained to the patient and the the limitations of evaluation and management by telemedicine and the availability of in person appointments.  The patient understood the limitations and agreed to proceed. Patient also understood that the telehealth visit is billable. . Location of the patient: Home . Location of the provider: Office Only the patient and myself were participating in the encounter    Reason for Appointment:  Hypothyroidism, followup visit   History of Present Illness:   The hypothyroidism was first diagnosed about 1990  She believes that her initial symptoms at diagnosis were fatigue, lethargy, brain fog and hair loss. She had been taking Synthroid for several years  However because of symptoms of being tired and lethargic as well as difficulty concentrating she had requested  Armour Thyroid in 2014 and with this she has had significant improvement in her energy level Her Armour thyroid dose has been adjusted since her first prescription   Since 2015 had been taking 90 mg, one tablet daily  CURRENT DOSAGE:   Her supplements were increased in 05/2018 when TSH had started going up again and was 5.5 She was complaining of feeling sleepy on and off but no weight change Also occasionally did have palpitations  Since 7/20 he is on a regimen of 90 mg of Armour Thyroid   along with additional 25 mcg of levothyroxine This was changed because of a high T3 level and low normal free T4 on her combination  She did not feel any different with this Continues to have occasional palpitations but this is mostly a feeling of fast pulse when she is lying down Has not checked her pulse at that time  No complaints of fatigue, overall feels good She may have lost 6 pounds with better diet  She is taking her supplements  consistently in the morning every day  Not taking any calcium or iron supplements with her Armour thyroid  Now her TSH is   lower at 0.63, was at 1.03 However her free T3 level is still high normal at 4.0 Free T4 slightly improved   Wt Readings from Last 3 Encounters:  02/21/18 185 lb 12.8 oz (84.3 kg)  12/19/17 186 lb 3.2 oz (84.5 kg)  12/18/16 180 lb 3.2 oz (81.7 kg)    Lab Results  Component Value Date   TSH 1.98 04/08/2019   TSH 0.63 12/05/2018   TSH 1.03 07/31/2018   FREET4 0.72 04/08/2019   FREET4 0.77 12/05/2018   FREET4 0.66 07/31/2018    Lab Results  Component Value Date   T3FREE 3.6 04/08/2019   T3FREE 4.0 12/05/2018   T3FREE 4.3 (H) 07/31/2018     Allergies as of 04/14/2019      Reactions   Banana Itching   Throat itches      Medication List       Accurate as of April 14, 2019  1:41 PM. If you have any questions, ask your nurse or doctor.        Armour Thyroid 90 MG tablet Generic drug: thyroid Take 1 tablet by mouth Mon-Sat and take 1/2 tablet on Sunday.   levothyroxine 25 MCG tablet Commonly known as: Synthroid Take 1 tablet (25 mcg total) by mouth daily before breakfast. Take 1 tablet with 90 mg of Armour Thyroid.  Stop  15 mg Armour Thyroid tablets       Allergies:  Allergies  Allergen Reactions  . Banana Itching    Throat itches    Past Medical History:  Diagnosis Date  . Fibrocystic breast disease   . GERD (gastroesophageal reflux disease)   . Herpes simplex virus infection   . Thyroid disease     Past Surgical History:  Procedure Laterality Date  . LAPAROSCOPY      Family History  Problem Relation Age of Onset  . Cancer Mother        breast  . Hypertension Mother   . Hyperlipidemia Mother   . Breast cancer Mother   . Heart disease Father   . Cancer Paternal Grandmother        breast  . Breast cancer Paternal Grandmother   . Thyroid cancer Maternal Aunt   . Diabetes Neg Hx     Social History:  reports that  she quit smoking about 20 years ago. She has never used smokeless tobacco. She reports current alcohol use of about 1.0 - 2.0 standard drinks of alcohol per week. She reports that she does not use drugs.  REVIEW Of SYSTEMS:  No history of hypertension   Examination:   LMP 01/27/2011        Assessment/ Treatment:  Hypothyroidism, primary and long-standing, without history of goiter on previous exams  She has been on Armour Thyroid 90 mcg along with 25 mcg of levothyroxine  Her doses were rearranged because of high T3 on taking extra Armour Thyroid since 5/20  Although she does feel her heart racing at night this is not yet improved She otherwise feels fairly good with her energy level She thinks she is losing weight with better diet  Currently her thyroid levels are back in the normal range although T3 is still upper normal and TSH slightly lower at 0.68  She will continue the same combination except 1/2 tablet less per week on the Armour Thyroid  Follow-up in 4 months again  Elayne Snare 04/14/2019, 1:41 PM

## 2019-04-14 NOTE — Progress Notes (Signed)
Patient ID: Lydia Dillon, female   DOB: August 17, 1965, 54 y.o.   MRN: [id number]   Reason for Appointment:  Hypothyroidism, followup visit   History of Present Illness:   The hypothyroidism was first diagnosed about 1990  She believes that her initial symptoms at diagnosis were fatigue, lethargy, brain fog and hair loss. She had been taking Synthroid for several years  However because of symptoms of being tired and lethargic as well as difficulty concentrating she had requested  Armour Thyroid in 2014 and with this she has had significant improvement in her energy level Her Armour thyroid dose has been adjusted since her first prescription   Since 2015 had been taking 90 mg, one tablet daily  CURRENT DOSAGE:   Her supplements were increased in 05/2018 when TSH had started going up again and was 5.5 She was complaining of feeling sleepy on and off but no weight change  Since 7/20 had been on a regimen of 90 mg of Armour Thyroid daily along with additional 25 mcg of levothyroxine This was changed because of a high T3 level and low normal free T4 on her combination  On her last visit she was complaining of occasional palpitations but this is mostly a feeling of fast pulse when she is lying down Since TSH was low normal at 0.6 and free T3 was upper normal her Armour Thyroid was reduced by 1/2 tablet/week  Since then she has not felt palpitations She gets a little sleepy sometimes in the afternoon but she thinks this is from her routine Her weight is variable No unusual fatigue, no hair loss  Not taking any calcium or iron supplements with her thyroid supplements which she takes on empty stomach in the morning daily  Now her TSH is around 2.0 Free T3 is not upper normal anymore Free T4 stable   Wt Readings from Last 3 Encounters:  04/14/19 182 lb (82.6 kg)  02/21/18 185 lb 12.8 oz (84.3 kg)  12/19/17 186 lb 3.2 oz (84.5 kg)    Lab Results  Component Value Date   TSH  1.98 04/08/2019   TSH 0.63 12/05/2018   TSH 1.03 07/31/2018   FREET4 0.72 04/08/2019   FREET4 0.77 12/05/2018   FREET4 0.66 07/31/2018    Lab Results  Component Value Date   T3FREE 3.6 04/08/2019   T3FREE 4.0 12/05/2018   T3FREE 4.3 (H) 07/31/2018     Allergies as of 04/14/2019      Reactions   Banana Itching   Throat itches      Medication List       Accurate as of April 14, 2019  3:40 PM. If you have any questions, ask your nurse or doctor.        Armour Thyroid 90 MG tablet Generic drug: thyroid Take 1 tablet by mouth Mon-Sat and take 1/2 tablet on Sunday.   levothyroxine 25 MCG tablet Commonly known as: Synthroid Take 1 tablet (25 mcg total) by mouth daily before breakfast. Take 1 tablet with 90 mg of Armour Thyroid.  Stop 15 mg Armour Thyroid tablets       Allergies:  Allergies  Allergen Reactions  . Banana Itching    Throat itches    Past Medical History:  Diagnosis Date  . Fibrocystic breast disease   . GERD (gastroesophageal reflux disease)   . Herpes simplex virus infection   . Thyroid disease     Past Surgical History:  Procedure  Laterality Date  . LAPAROSCOPY      Family History  Problem Relation Age of Onset  . Cancer Mother        breast  . Hypertension Mother   . Hyperlipidemia Mother   . Breast cancer Mother   . Heart disease Father   . Cancer Paternal Grandmother        breast  . Breast cancer Paternal Grandmother   . Thyroid cancer Maternal Aunt   . Diabetes Neg Hx     Social History:  reports that she quit smoking about 20 years ago. She has never used smokeless tobacco. She reports current alcohol use of about 1.0 - 2.0 standard drinks of alcohol per week. She reports that she does not use drugs.  REVIEW Of SYSTEMS:  She is not on any other prescription drugs   Examination:   BP 138/70 (BP Location: Left Arm, Patient Position: Sitting, Cuff Size: Normal)   Pulse 75   Ht 4\' 11"  (1.499 m)   Wt 182 lb (82.6 kg)    LMP 01/27/2011   SpO2 98%   BMI 36.76 kg/m   No thyroid enlargement felt Biceps reflexes appear normal to brisk    Assessment/ Treatment:  Hypothyroidism, primary and long-standing, without history of goiter on previous exams  She has been on Armour Thyroid 90 mcg along with 25 mcg of levothyroxine With reducing Armour Thyroid by half a tablet weekly she has less palpitations Overall feels fairly good  TSH and free T4/T3 are excellent  She will continue same regimen of both her supplements and follow-up in 6 months  Jarriel Papillion 04/14/2019, 3:40 PM

## 2019-04-14 NOTE — Progress Notes (Deleted)
Per instructions of Dr. Lucianne Muss, pt was given sample of Lyumjev insulin.  Pt given one vial on insulin. CXF:[id number] F ES: 05/11/2020

## 2019-05-24 ENCOUNTER — Other Ambulatory Visit: Payer: Self-pay | Admitting: Endocrinology

## 2019-07-29 ENCOUNTER — Other Ambulatory Visit: Payer: Self-pay | Admitting: Endocrinology

## 2019-08-12 ENCOUNTER — Other Ambulatory Visit: Payer: Self-pay | Admitting: *Deleted

## 2019-08-12 MED ORDER — LEVOTHYROXINE SODIUM 25 MCG PO TABS: ORAL_TABLET | ORAL | 1 refills | Status: DC

## 2019-08-27 ENCOUNTER — Other Ambulatory Visit: Payer: Self-pay | Admitting: Endocrinology

## 2019-10-02 ENCOUNTER — Other Ambulatory Visit: Payer: Self-pay | Admitting: Endocrinology

## 2019-10-08 ENCOUNTER — Other Ambulatory Visit (INDEPENDENT_AMBULATORY_CARE_PROVIDER_SITE_OTHER): Payer: Self-pay

## 2019-10-08 ENCOUNTER — Other Ambulatory Visit: Payer: Self-pay

## 2019-10-08 DIAGNOSIS — E063 Autoimmune thyroiditis: Secondary | ICD-10-CM

## 2019-10-08 LAB — T3, FREE: T3, Free: 3.3 pg/mL (ref 2.3–4.2)

## 2019-10-08 LAB — T4, FREE: Free T4: 0.63 ng/dL (ref 0.60–1.60)

## 2019-10-08 LAB — TSH: TSH: 3.03 u[IU]/mL (ref 0.35–4.50)

## 2019-10-12 ENCOUNTER — Other Ambulatory Visit: Payer: Self-pay | Admitting: Endocrinology

## 2019-10-13 ENCOUNTER — Other Ambulatory Visit: Payer: BC Managed Care – PPO

## 2019-10-16 ENCOUNTER — Ambulatory Visit (INDEPENDENT_AMBULATORY_CARE_PROVIDER_SITE_OTHER): Payer: Self-pay | Admitting: Endocrinology

## 2019-10-16 ENCOUNTER — Other Ambulatory Visit: Payer: Self-pay

## 2019-10-16 ENCOUNTER — Encounter: Payer: Self-pay | Admitting: Endocrinology

## 2019-10-16 VITALS — BP 134/84 | HR 80 | Ht 59.0 in | Wt 182.0 lb

## 2019-10-16 DIAGNOSIS — E063 Autoimmune thyroiditis: Secondary | ICD-10-CM

## 2019-10-16 NOTE — Patient Instructions (Signed)
[email or web address]  Armour thyoid 1 pill daily

## 2019-10-16 NOTE — Progress Notes (Signed)
Patient ID: Lydia Dillon, female   DOB: 1965-10-28, 54 y.o.   MRN: [id number]   Reason for Appointment:  Hypothyroidism, followup visit   History of Present Illness:   The hypothyroidism was first diagnosed about 1990  She believes that her initial symptoms at diagnosis were fatigue, lethargy, brain fog and hair loss. She had been taking Synthroid for several years  However because of symptoms of being tired and lethargic as well as difficulty concentrating she had requested  Armour Thyroid in 2014 and with this she has had significant improvement in her energy level Her Armour thyroid dose has been adjusted since her first prescription   Since 2015 had been taking 90 mg Armour Thyroid  CURRENT DOSAGE:   Her supplements were increased in 05/2018 when TSH had started going up again and was 5.5 She was complaining of feeling sleepy on and off but no weight change  Since 7/20 had been on a regimen of 90 mg of Armour Thyroid daily along with additional 25 mcg of levothyroxine This was changed because of a high T3 level and low normal free T4 on her combination  Previously had occasional palpitations but this is mostly a feeling of fast pulse when she is lying down Since TSH was low normal at 0.6 and free T3 was upper normal her Armour Thyroid was reduced by 1/2 tablet/week Since then she has not felt palpitations  Overall she feels about the same, because of getting early in the morning where she gets a little tired and sleepy in the afternoon Her weight is slightly better She is taking her supplement quite regularly as directed before breakfast Takes the supplements without any thing else at the same time  Now her TSH is 3 compared to 2.0 and gradually increasing Free T3 is also slightly lower compared to prior levels Free T4 0.63   Wt Readings from Last 3 Encounters:  04/14/19 182 lb (82.6 kg)  02/21/18 185 lb 12.8 oz (84.3 kg)  12/19/17 186 lb 3.2 oz (84.5 kg)     Lab Results  Component Value Date   TSH 3.03 10/08/2019   TSH 1.98 04/08/2019   TSH 0.63 12/05/2018   FREET4 0.63 10/08/2019   FREET4 0.72 04/08/2019   FREET4 0.77 12/05/2018    Lab Results  Component Value Date   T3FREE 3.3 10/08/2019   T3FREE 3.6 04/08/2019   T3FREE 4.0 12/05/2018     Allergies as of 10/16/2019      Reactions   Banana Itching   Throat itches      Medication List       Accurate as of October 16, 2019  3:33 PM. If you have any questions, ask your nurse or doctor.        Armour Thyroid 90 MG tablet Generic drug: thyroid TAKE 1 TABLET BY MOUTH ONCE DAILY MONDAY  THROUGH  SATURDAY  AND  THEN  TAKE  ONE-HALF  (1/2)  TABLET  ON  SUNDAY   levothyroxine 25 MCG tablet Commonly known as: Euthyrox Take 1 tablet (25 mcg total) by mouth daily before breakfast.       Allergies:  Allergies  Allergen Reactions  . Banana Itching    Throat itches    Past Medical History:  Diagnosis Date  . Fibrocystic breast disease   . GERD (gastroesophageal reflux disease)   . Herpes simplex virus infection   . Thyroid disease     Past Surgical History:  Procedure Laterality Date  . LAPAROSCOPY      Family History  Problem Relation Age of Onset  . Cancer Mother        breast  . Hypertension Mother   . Hyperlipidemia Mother   . Breast cancer Mother   . Heart disease Father   . Cancer Paternal Grandmother        breast  . Breast cancer Paternal Grandmother   . Thyroid cancer Maternal Aunt   . Diabetes Neg Hx     Social History:  reports that she quit smoking about 21 years ago. She has never used smokeless tobacco. She reports current alcohol use of about 1.0 - 2.0 standard drink of alcohol per week. She reports that she does not use drugs.  REVIEW Of SYSTEMS:  She is not on any other prescription drugs  Covid vaccines: She has not been sure about taking the shots because of her fear that the vaccine was developed too quickly and may not be  safe   Examination:   LMP 01/27/2011   No thyroid enlargement felt Biceps reflexes appear normal     Assessment/ Treatment:  Hypothyroidism, primary and long-standing, without history of goiter on previous exams  She has been on Armour Thyroid 90 mcg 6-1/2 pills a week along with 25 mcg of levothyroxine Although her thyroid levels are normal her TSH is trending gradually higher She has mild fatigue but this is possibly work-related  Free T4 and free T3 are trending lower  To normalize her levels she will now take the Armour Thyroid 1 pill every single day and continue 25 levothyroxine also  Discussed in detail the development of the Covid vaccine, safety, results, need for protection against delta variant as well as to 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

## 2019-11-03 ENCOUNTER — Other Ambulatory Visit: Payer: Self-pay | Admitting: Endocrinology

## 2019-11-12 ENCOUNTER — Other Ambulatory Visit: Payer: Self-pay | Admitting: Endocrinology

## 2019-11-12 ENCOUNTER — Other Ambulatory Visit: Payer: Self-pay | Admitting: Family Medicine

## 2019-11-12 DIAGNOSIS — Z1231 Encounter for screening mammogram for malignant neoplasm of breast: Secondary | ICD-10-CM

## 2019-11-14 DIAGNOSIS — Z1231 Encounter for screening mammogram for malignant neoplasm of breast: Secondary | ICD-10-CM

## 2019-12-17 ENCOUNTER — Ambulatory Visit
Admission: RE | Admit: 2019-12-17 | Discharge: 2019-12-17 | Disposition: A | Discharge status: Home / Self Care | Payer: BC Managed Care – PPO | Source: Ambulatory Visit | Attending: Family Medicine | Admitting: Family Medicine

## 2019-12-17 ENCOUNTER — Other Ambulatory Visit: Payer: Self-pay

## 2019-12-17 DIAGNOSIS — Z1231 Encounter for screening mammogram for malignant neoplasm of breast: Secondary | ICD-10-CM

## 2020-01-31 ENCOUNTER — Other Ambulatory Visit: Payer: Self-pay | Admitting: Endocrinology

## 2020-02-12 ENCOUNTER — Other Ambulatory Visit: Payer: Self-pay

## 2020-02-12 ENCOUNTER — Other Ambulatory Visit (INDEPENDENT_AMBULATORY_CARE_PROVIDER_SITE_OTHER): Payer: BC Managed Care – PPO

## 2020-02-12 DIAGNOSIS — E063 Autoimmune thyroiditis: Secondary | ICD-10-CM

## 2020-02-12 LAB — T3, FREE: T3, Free: 3.6 pg/mL (ref 2.3–4.2)

## 2020-02-12 LAB — T4, FREE: Free T4: 0.66 ng/dL (ref 0.60–1.60)

## 2020-02-12 LAB — TSH: TSH: 2.31 u[IU]/mL (ref 0.35–4.50)

## 2020-02-16 ENCOUNTER — Encounter: Payer: Self-pay | Admitting: Endocrinology

## 2020-02-16 ENCOUNTER — Other Ambulatory Visit: Payer: Self-pay

## 2020-02-16 ENCOUNTER — Ambulatory Visit: Payer: BC Managed Care – PPO | Admitting: Endocrinology

## 2020-02-16 VITALS — BP 124/82 | HR 80 | Ht 59.0 in | Wt 181.2 lb

## 2020-02-16 DIAGNOSIS — E063 Autoimmune thyroiditis: Secondary | ICD-10-CM

## 2020-02-16 NOTE — Progress Notes (Signed)
Patient ID: Lydia Dillon, female   DOB: 04/12/1965, 55 y.o.   MRN: [id number]   Reason for Appointment:  Hypothyroidism, followup visit   History of Present Illness:   The hypothyroidism was first diagnosed about 1990  She believes that her initial symptoms at diagnosis were fatigue, lethargy, brain fog and hair loss. She had been taking Synthroid for several years  However because of symptoms of being tired and lethargic as well as difficulty concentrating she had requested  Armour Thyroid in 2014 and with this she has had significant improvement in her energy level Her Armour thyroid dose has been adjusted since her first prescription   Since 2015 had been taking 90 mg Armour Thyroid  RECENT HISTORY:  In 05/2018 TSH started going up again and was 5.5 She was complaining of feeling sleepy on and off but no weight change  Since 7/20 had been on a regimen of 90 mg of Armour Thyroid daily along with additional 25 mcg of levothyroxine This was changed because of a high T3 level and low normal free T4 on Armour Thyroid alone  She does not complain of any palpitations since her Armour Thyroid was reduced and T3 level has been normal She feels fairly good overall, may have some afternoon fatigue or sleepiness because of getting up early in the morning or stress  Weight is about the same No significant hair loss  She is taking both her supplements consistently about an hour before breakfast  With taking her Armour Thyroid 7 tablets a week since her last visit her TSH is leveled off at 2.3 T3 and T4 are stable  Wt Readings from Last 3 Encounters:  02/16/20 181 lb 3.2 oz (82.2 kg)  10/16/19 182 lb (82.6 kg)  04/14/19 182 lb (82.6 kg)    Lab Results  Component Value Date   TSH 2.31 02/12/2020   TSH 3.03 10/08/2019   TSH 1.98 04/08/2019   FREET4 0.66 02/12/2020   FREET4 0.63 10/08/2019   FREET4 0.72 04/08/2019    Lab Results  Component Value Date   T3FREE 3.6  02/12/2020   T3FREE 3.3 10/08/2019   T3FREE 3.6 04/08/2019     Allergies as of 02/16/2020      Reactions   Banana Itching   Throat itches      Medication List       Accurate as of February 16, 2020  3:15 PM. If you have any questions, ask your nurse or doctor.        Armour Thyroid 90 MG tablet Generic drug: thyroid TAKE 1 TABLET BY MOUTH ONCE DAILY MONDAY  THROUGH  SATURDAY  AND  TAKE  1/2  (ONE-HALF)  TABLET  ON  SUNDAY   Euthyrox 25 MCG tablet Generic drug: levothyroxine TAKE 1 TABLET BY MOUTH ONCE DAILY BEFORE BREAKFAST       Allergies:  Allergies  Allergen Reactions  . Banana Itching    Throat itches    Past Medical History:  Diagnosis Date  . Fibrocystic breast disease   . GERD (gastroesophageal reflux disease)   . Herpes simplex virus infection   . Thyroid disease     Past Surgical History:  Procedure Laterality Date  . LAPAROSCOPY      Family History  Problem Relation Age of Onset  . Cancer Mother        breast  . Hypertension Mother   . Hyperlipidemia Mother   . Breast cancer Mother   .  Heart disease Father   . Cancer Paternal Grandmother        breast  . Breast cancer Paternal Grandmother   . Thyroid cancer Maternal Aunt   . Diabetes Neg Hx     Social History:  reports that she quit smoking about 21 years ago. She has never used smokeless tobacco. She reports current alcohol use of about 1.0 - 2.0 standard drink of alcohol per week. She reports that she does not use drugs.  REVIEW Of SYSTEMS:  She is not on any other prescription drugs  Covid vaccines: She has been refusing to taking the shots because of her fear that the vaccine was developed too quickly and still has not done so despite detailed discussion of the previous visit   Examination:   BP 124/82   Pulse 80   Ht 4\' 11"  (1.499 m)   Wt 181 lb 3.2 oz (82.2 kg)   LMP 01/27/2011   SpO2 98%   BMI 36.60 kg/m    Thyroid not palpable  Lymph: No lymphadenopathy     Assessment/ Treatment:  Hypothyroidism, primary and long-standing, without history of goiter  She has been on Armour Thyroid 90 mcg daily along with 25 mcg of levothyroxine No symptoms of fatigue recently She is quite regular with her supplements as directed before breakfast  Free T4 and free T3 are stable as well as TSH is mid normal  She will continue the same regimen and follow-up annually now  03/27/2011 02/16/2020, 3:15 PM

## 2020-03-09 ENCOUNTER — Other Ambulatory Visit: Payer: Self-pay | Admitting: Endocrinology

## 2020-04-09 ENCOUNTER — Other Ambulatory Visit: Payer: Self-pay | Admitting: Endocrinology

## 2020-05-01 ENCOUNTER — Other Ambulatory Visit: Payer: Self-pay | Admitting: Endocrinology

## 2020-05-03 ENCOUNTER — Other Ambulatory Visit: Payer: Self-pay | Admitting: *Deleted

## 2020-05-03 DIAGNOSIS — E063 Autoimmune thyroiditis: Secondary | ICD-10-CM

## 2020-05-03 MED ORDER — LEVOTHYROXINE SODIUM 25 MCG PO TABS: 25.0000 ug | ORAL_TABLET | Freq: Every day | ORAL | 5 refills | Status: DC

## 2020-05-03 NOTE — Telephone Encounter (Signed)
Pt called to request a refill for her armor thyroid. She took her last dose this morning. Please send to  [facility] [address] [place] - [address] Phone:  [phone]  Fax:  [phone]

## 2020-05-03 NOTE — Telephone Encounter (Signed)
Rx sent to the pharmacy.

## 2020-07-30 ENCOUNTER — Other Ambulatory Visit: Payer: Self-pay | Admitting: Endocrinology

## 2020-08-27 ENCOUNTER — Other Ambulatory Visit: Payer: Self-pay

## 2020-08-27 ENCOUNTER — Ambulatory Visit (INDEPENDENT_AMBULATORY_CARE_PROVIDER_SITE_OTHER): Payer: BC Managed Care – PPO

## 2020-08-27 ENCOUNTER — Ambulatory Visit: Payer: BC Managed Care – PPO | Admitting: Podiatry

## 2020-08-27 DIAGNOSIS — M778 Other enthesopathies, not elsewhere classified: Secondary | ICD-10-CM | POA: Diagnosis not present

## 2020-08-27 DIAGNOSIS — M19079 Primary osteoarthritis, unspecified ankle and foot: Secondary | ICD-10-CM | POA: Diagnosis not present

## 2020-08-27 DIAGNOSIS — M779 Enthesopathy, unspecified: Secondary | ICD-10-CM

## 2020-09-01 NOTE — Progress Notes (Signed)
  Subjective:  Patient ID: Lydia Dillon, female    DOB: 1965/06/24,  MRN: [id number]  Chief Complaint  Patient presents with   Foot Pain    Left foot pain on the top of the foot     55 y.o. female presents with the above complaint. Patient presents with complaint left dorsal midfoot pain.  Patient states been going for quite some time there is swelling on top of the foot there is tingling sensation throughout the foot.  She states that it sometimes hurts and gets worse with ambulation.  She has not seen anyone else prior to seeing me.  She would like to discuss treatment options.  She states the pain is very mild in nature.  She does not want to do any invasive or surgical options at this time.   Review of Systems: Negative except as noted in the HPI. Denies N/V/F/Ch.  Past Medical History:  Diagnosis Date   Fibrocystic breast disease    GERD (gastroesophageal reflux disease)    Herpes simplex virus infection    Thyroid disease     Current Outpatient Medications:    ARMOUR THYROID 90 MG tablet, TAKE 1 TABLET BY MOUTH ONCE DAILY MONDAY  THROUGH  SATURDAY  AND  TAKE  1/2  (ONE-HALF)  TABLET  ON  SUNDAY, Disp: 90 tablet, Rfl: 0   levothyroxine (EUTHYROX) 25 MCG tablet, Take 1 tablet (25 mcg total) by mouth daily before breakfast., Disp: 30 tablet, Rfl: 5  Social History   Tobacco Use  Smoking Status Former   Types: Cigarettes   Quit date: 08/08/1998   Years since quitting: 22.0  Smokeless Tobacco Never    Allergies  Allergen Reactions   Banana Itching    Throat itches   Objective:  There were no vitals filed for this visit. There is no height or weight on file to calculate BMI. Constitutional Well developed. Well nourished.  Vascular Dorsalis pedis pulses palpable bilaterally. Posterior tibial pulses palpable bilaterally. Capillary refill normal to all digits.  No cyanosis or clubbing noted. Pedal hair growth normal.  Neurologic Normal speech. Oriented to person,  place, and time. Epicritic sensation to light touch grossly present bilaterally.  Dermatologic Nails well groomed and normal in appearance. No open wounds. No skin lesions.  Orthopedic: Pain on palpation with resisted dorsiflexion of the digits as well as plantar flexion of the knee consistent with extensor tendinitis.  Pain on palpation to the midfoot.  Clinically unable to appreciate small exostosis consistent with osteoarthritic changes mild.  This likely resulting neuritis as well.   Radiographs: Mild midfoot arthritis noted.  Posterior heel spurring noted.  Plantar heel spurring noted.  No other bony abnormalities identified Assessment:   1. Extensor tendinitis of foot   2. Arthritis of midfoot    Plan:  Patient was evaluated and treated and all questions answered.  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  Ultimately I discussed with her that given that this is very mild in nature I will hold off on steroid injection.  For now I discussed with her offloading the area as well as shoe gear changes.  I discussed shoe gear modification extensive detail. -Also discussed power steps over-the-counter.  If there is no improvement we will discuss custom orthotics.  She states understanding.  No follow-ups on file.

## 2020-10-26 ENCOUNTER — Other Ambulatory Visit: Payer: Self-pay | Admitting: Endocrinology

## 2020-11-30 ENCOUNTER — Other Ambulatory Visit: Payer: Self-pay | Admitting: Family Medicine

## 2020-11-30 DIAGNOSIS — Z1231 Encounter for screening mammogram for malignant neoplasm of breast: Secondary | ICD-10-CM

## 2020-12-20 ENCOUNTER — Other Ambulatory Visit: Payer: Self-pay

## 2020-12-20 ENCOUNTER — Ambulatory Visit
Admission: RE | Admit: 2020-12-20 | Discharge: 2020-12-20 | Disposition: A | Discharge status: Home / Self Care | Payer: BC Managed Care – PPO | Source: Ambulatory Visit

## 2020-12-20 DIAGNOSIS — Z1231 Encounter for screening mammogram for malignant neoplasm of breast: Secondary | ICD-10-CM

## 2021-01-29 ENCOUNTER — Other Ambulatory Visit: Payer: Self-pay | Admitting: Endocrinology

## 2021-02-14 ENCOUNTER — Other Ambulatory Visit: Payer: Self-pay

## 2021-02-14 ENCOUNTER — Other Ambulatory Visit (INDEPENDENT_AMBULATORY_CARE_PROVIDER_SITE_OTHER): Payer: BC Managed Care – PPO

## 2021-02-14 DIAGNOSIS — E063 Autoimmune thyroiditis: Secondary | ICD-10-CM | POA: Diagnosis not present

## 2021-02-14 LAB — TSH: TSH: 1.07 u[IU]/mL (ref 0.35–5.50)

## 2021-02-14 LAB — T3, FREE: T3, Free: 3.7 pg/mL (ref 2.3–4.2)

## 2021-02-14 LAB — T4, FREE: Free T4: 0.77 ng/dL (ref 0.60–1.60)

## 2021-02-17 ENCOUNTER — Ambulatory Visit: Payer: BC Managed Care – PPO | Admitting: Endocrinology

## 2021-02-17 ENCOUNTER — Encounter: Payer: Self-pay | Admitting: Endocrinology

## 2021-02-17 ENCOUNTER — Other Ambulatory Visit: Payer: Self-pay

## 2021-02-17 VITALS — BP 124/70 | HR 69 | Ht 59.0 in | Wt 175.6 lb

## 2021-02-17 DIAGNOSIS — E063 Autoimmune thyroiditis: Secondary | ICD-10-CM | POA: Diagnosis not present

## 2021-02-17 NOTE — Progress Notes (Signed)
Patient ID: Lydia Dillon, female   DOB: [birth year], 56 y.o.   MRN: [id number]   Reason for Appointment:  Hypothyroidism, followup visit   History of Present Illness:   The hypothyroidism was first diagnosed about 1990  She believes that her initial symptoms at diagnosis were fatigue, lethargy, brain fog and hair loss. She had been taking Synthroid for several years  However because of symptoms of being tired and lethargic as well as difficulty concentrating she had requested  Armour Thyroid in 2014 and with this she has had significant improvement in her energy level Her Armour thyroid dose has been adjusted since her first prescription   Since 2015 had been taking 90 mg Armour Thyroid  RECENT HISTORY:  In 05/2018 TSH started going up again and was 5.5 She was complaining of feeling sleepy on and off but no weight change  Since 7/20 had been on a regimen of 90 mg of Armour Thyroid daily along with additional 25 mcg of levothyroxine This was changed because of a high T3 level and low normal free T4 on Armour Thyroid alone  She does not complain of any palpitations since her Armour Thyroid was reduced previously because of high free T3 level  She is returning for her annual visit She does not feel unusually tired, as before may have fatigue on the days she is working and getting up early in the morning No recent complaints of cold intolerance or hair loss Her weight has come down slightly  She is taking both her supplements consistently about an hour before breakfast  TSH is consistently normal now T3 and T4 are about the same  Wt Readings from Last 3 Encounters:  02/17/21 175 lb 9.6 oz (79.7 kg)  02/16/20 181 lb 3.2 oz (82.2 kg)  10/16/19 182 lb (82.6 kg)    Lab Results  Component Value Date   TSH 1.07 02/14/2021   TSH 2.31 02/12/2020   TSH 3.03 10/08/2019   FREET4 0.77 02/14/2021   FREET4 0.66 02/12/2020   FREET4 0.63 10/08/2019    Lab Results   Component Value Date   T3FREE 3.7 02/14/2021   T3FREE 3.6 02/12/2020   T3FREE 3.3 10/08/2019     Allergies as of 02/17/2021       Reactions   Banana Itching   Throat itches        Medication List        Accurate as of February 17, 2021  3:36 PM. If you have any questions, ask your nurse or doctor.          Armour Thyroid 90 MG tablet Generic drug: thyroid TAKE 1 TABLET BY MOUTH ONCE DAILY ON MONDAY-SATURDAY AND TAKE 1/2 TABLET BY MOUTH ON SUNDAYS   levothyroxine 25 MCG tablet Commonly known as: Euthyrox Take 1 tablet (25 mcg total) by mouth daily before breakfast.        Allergies:  Allergies  Allergen Reactions   Banana Itching    Throat itches    Past Medical History:  Diagnosis Date   Fibrocystic breast disease    GERD (gastroesophageal reflux disease)    Herpes simplex virus infection    Thyroid disease     Past Surgical History:  Procedure Laterality Date   LAPAROSCOPY      Family History  Problem Relation Age of Onset   Cancer Mother        breast   Hypertension Mother    Hyperlipidemia Mother  Breast cancer Mother    Heart disease Father    Cancer Paternal Grandmother        breast   Breast cancer Paternal Grandmother    Thyroid cancer Maternal Aunt    Diabetes Neg Hx     Social History:  reports that she quit smoking about 22 years ago. Her smoking use included cigarettes. She has never used smokeless tobacco. She reports current alcohol use of about 1.0 - 2.0 standard drink per week. She reports that she does not use drugs.  REVIEW Of SYSTEMS:     Examination:   BP 124/70    Pulse 69    Ht 4\' 11"  (1.499 m)    Wt 175 lb 9.6 oz (79.7 kg)    LMP 01/27/2011    SpO2 94%    BMI 35.47 kg/m    Thyroid not palpable  Biceps reflexes appear to be normal    Assessment/ Treatment:  Hypothyroidism, primary and long-standing, without history of goiter  She has been on Armour Thyroid 90 mcg daily along with 25 mcg of  levothyroxine She feels subjectively fairly good and is regular with her thyroid supplements every morning  Has had a stable regimen of her medications for some time now   She will continue the same regimen and follow-up annually now  03/27/2011 02/17/2021, 3:36 PM

## 2021-04-25 ENCOUNTER — Other Ambulatory Visit: Payer: Self-pay | Admitting: Endocrinology

## 2021-04-25 DIAGNOSIS — E063 Autoimmune thyroiditis: Secondary | ICD-10-CM

## 2021-05-08 ENCOUNTER — Other Ambulatory Visit: Payer: Self-pay | Admitting: Endocrinology

## 2021-05-25 ENCOUNTER — Other Ambulatory Visit: Payer: Self-pay | Admitting: Endocrinology

## 2021-05-25 DIAGNOSIS — E063 Autoimmune thyroiditis: Secondary | ICD-10-CM

## 2021-08-14 ENCOUNTER — Other Ambulatory Visit: Payer: Self-pay | Admitting: Endocrinology

## 2021-08-14 DIAGNOSIS — E063 Autoimmune thyroiditis: Secondary | ICD-10-CM

## 2021-09-16 ENCOUNTER — Other Ambulatory Visit: Payer: Self-pay | Admitting: Endocrinology

## 2021-09-16 DIAGNOSIS — E063 Autoimmune thyroiditis: Secondary | ICD-10-CM

## 2021-12-14 ENCOUNTER — Other Ambulatory Visit: Payer: Self-pay | Admitting: Internal Medicine

## 2021-12-14 DIAGNOSIS — Z1231 Encounter for screening mammogram for malignant neoplasm of breast: Secondary | ICD-10-CM

## 2022-02-09 ENCOUNTER — Ambulatory Visit
Admission: RE | Admit: 2022-02-09 | Discharge: 2022-02-09 | Disposition: A | Discharge status: Home / Self Care | Payer: BC Managed Care – PPO | Source: Ambulatory Visit | Attending: Internal Medicine | Admitting: Internal Medicine

## 2022-02-09 DIAGNOSIS — Z1231 Encounter for screening mammogram for malignant neoplasm of breast: Secondary | ICD-10-CM

## 2022-02-13 ENCOUNTER — Other Ambulatory Visit (INDEPENDENT_AMBULATORY_CARE_PROVIDER_SITE_OTHER): Payer: BC Managed Care – PPO

## 2022-02-13 DIAGNOSIS — E063 Autoimmune thyroiditis: Secondary | ICD-10-CM

## 2022-02-14 ENCOUNTER — Other Ambulatory Visit: Payer: Self-pay | Admitting: Endocrinology

## 2022-02-14 LAB — TSH: TSH: 1.68 u[IU]/mL (ref 0.35–5.50)

## 2022-02-14 LAB — T4, FREE: Free T4: 0.62 ng/dL (ref 0.60–1.60)

## 2022-02-16 ENCOUNTER — Ambulatory Visit: Payer: BC Managed Care – PPO | Admitting: Endocrinology

## 2022-02-20 ENCOUNTER — Ambulatory Visit: Payer: BC Managed Care – PPO | Admitting: Endocrinology

## 2022-02-20 ENCOUNTER — Encounter: Payer: Self-pay | Admitting: Endocrinology

## 2022-02-20 VITALS — BP 124/72 | HR 87 | Ht 59.0 in | Wt 176.2 lb

## 2022-02-20 DIAGNOSIS — E063 Autoimmune thyroiditis: Secondary | ICD-10-CM

## 2022-02-20 NOTE — Progress Notes (Unsigned)
Patient ID: Lydia Dillon, female   DOB: [birth year], 57 y.o.   MRN: [id number]   Reason for Appointment:  Hypothyroidism, followup visit   History of Present Illness:   The hypothyroidism was first diagnosed about 1990  She believes that her initial symptoms at diagnosis were fatigue, lethargy, brain fog and hair loss. She had been taking Synthroid for several years  However because of symptoms of being tired and lethargic as well as difficulty concentrating she had requested  Armour Thyroid in 2014 and with this she has had significant improvement in her energy level Her Armour thyroid dose has been adjusted since her first prescription   Since 2015 had been taking 90 mg Armour Thyroid  RECENT HISTORY:  In 05/2018 TSH started going up again and was 5.5 She was complaining of feeling sleepy on and off but no weight change  Since 7/20 had been on a regimen of 90 mg of Armour Thyroid daily along with additional 25 mcg of levothyroxine This was changed because of a high T3 level and low normal free T4 on Armour Thyroid alone  She does not complain of any palpitations since her Armour Thyroid was reduced previously because of high free T3 level  She is returning for her annual visit She does not feel unusually tired, as before may have fatigue on the days she is working and getting up early in the morning No recent complaints of cold intolerance or hair loss Her weight has come down slightly  She is taking both her supplements consistently about an hour before breakfast  TSH is consistently normal now T3 and T4 are about the same  Wt Readings from Last 3 Encounters:  02/20/22 176 lb 4 oz (79.9 kg)  02/17/21 175 lb 9.6 oz (79.7 kg)  02/16/20 181 lb 3.2 oz (82.2 kg)    Lab Results  Component Value Date   TSH 1.68 02/13/2022   TSH 1.07 02/14/2021   TSH 2.31 02/12/2020   FREET4 0.62 02/13/2022   FREET4 0.77 02/14/2021   FREET4 0.66 02/12/2020    Lab Results   Component Value Date   T3FREE 3.7 02/14/2021   T3FREE 3.6 02/12/2020   T3FREE 3.3 10/08/2019     Allergies as of 02/20/2022       Reactions   Banana Itching   Throat itches   Pineapple Itching   Itchy throat        Medication List        Accurate as of February 20, 2022  3:53 PM. If you have any questions, ask your nurse or doctor.          Armour Thyroid 90 MG tablet Generic drug: thyroid TAKE 1 TABLET BY MOUTH ONCE DAILY MONDAY-SATURDAY  AND  TAKE  ONE  HALF  TABLET  ON  SUNDAYS   levothyroxine 25 MCG tablet Commonly known as: SYNTHROID TAKE 1 TABLET BY MOUTH ONCE DAILY IN THE MORNING BEFORE BREAKFAST        Allergies:  Allergies  Allergen Reactions   Banana Itching    Throat itches   Pineapple Itching    Itchy throat    Past Medical History:  Diagnosis Date   Fibrocystic breast disease    GERD (gastroesophageal reflux disease)    Herpes simplex virus infection    Thyroid disease     Past Surgical History:  Procedure Laterality Date   LAPAROSCOPY      Family History  Problem  Relation Age of Onset   Cancer Mother        breast   Hypertension Mother    Hyperlipidemia Mother    Breast cancer Mother    Heart disease Father    Cancer Paternal Grandmother        breast   Breast cancer Paternal Grandmother    Thyroid disease Maternal Aunt    Thyroid cancer Maternal Aunt    Diabetes Neg Hx     Social History:  reports that she quit smoking about 23 years ago. Her smoking use included cigarettes. She has never used smokeless tobacco. She reports current alcohol use of about 1.0 - 2.0 standard drink of alcohol per week. She reports that she does not use drugs.  REVIEW Of SYSTEMS:     Examination:   BP 124/72   Pulse 87   Ht 4\' 11"  (1.499 m)   Wt 176 lb 4 oz (79.9 kg)   LMP 01/27/2011   SpO2 99%   BMI 35.60 kg/m   Thyroid not palpable  Biceps reflexes appear to be normal    Assessment/ Treatment:  Hypothyroidism, primary and  long-standing, without history of goiter  She has been on Armour Thyroid 90 mcg daily along with 25 mcg of levothyroxine She feels subjectively fairly good and is regular with her thyroid supplements every morning  Has had a stable regimen of her medications for some time now   She will continue the same regimen and follow-up annually now  Lydia Dillon 02/20/2022, 3:53 PM

## 2022-05-10 ENCOUNTER — Other Ambulatory Visit: Payer: Self-pay | Admitting: Endocrinology

## 2022-09-15 ENCOUNTER — Other Ambulatory Visit: Payer: Self-pay | Admitting: Endocrinology

## 2022-09-15 DIAGNOSIS — E063 Autoimmune thyroiditis: Secondary | ICD-10-CM

## 2022-10-01 IMAGING — MG MM DIGITAL SCREENING BILAT W/ TOMO AND CAD
8 series · 8 of 24 positions shown · non-contrast
Comparison: Previous exam(s).

CLINICAL DATA: Screening.

EXAM:
DIGITAL SCREENING BILATERAL MAMMOGRAM WITH TOMOSYNTHESIS AND CAD
TECHNIQUE: Bilateral screening digital craniocaudal and mediolateral oblique
mammograms were obtained. Bilateral screening digital breast
tomosynthesis was performed. The images were evaluated with
computer-aided detection.

[L CC synth-2D]
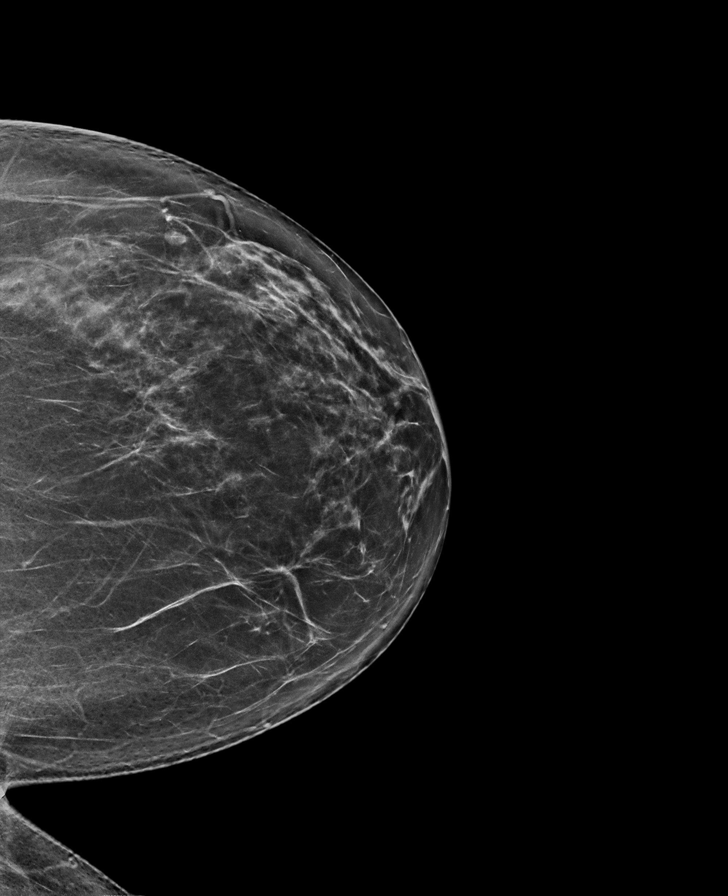

[L MLO synth-2D]
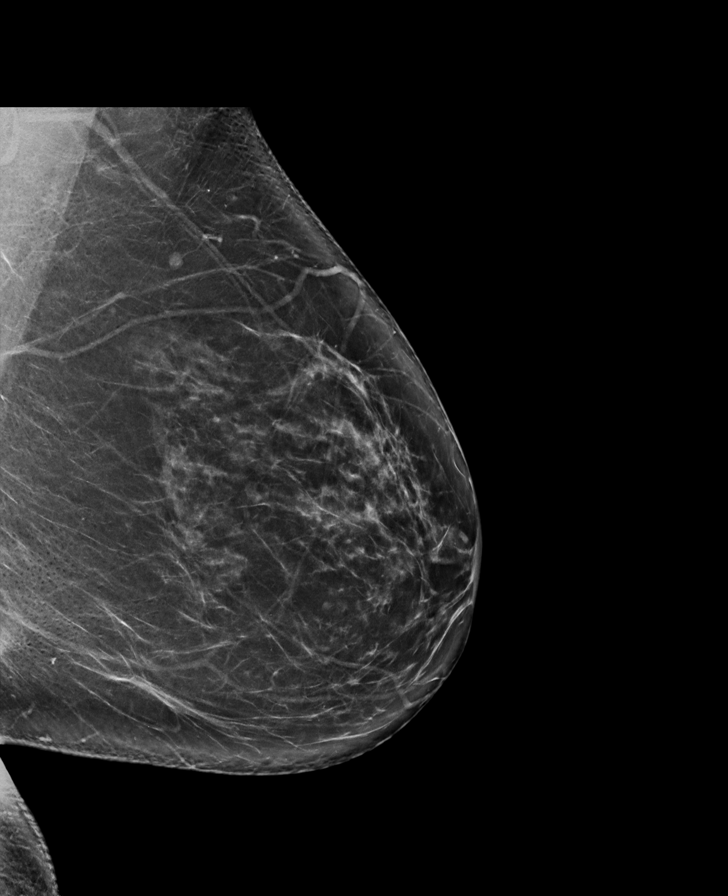

[R CC synth-2D]
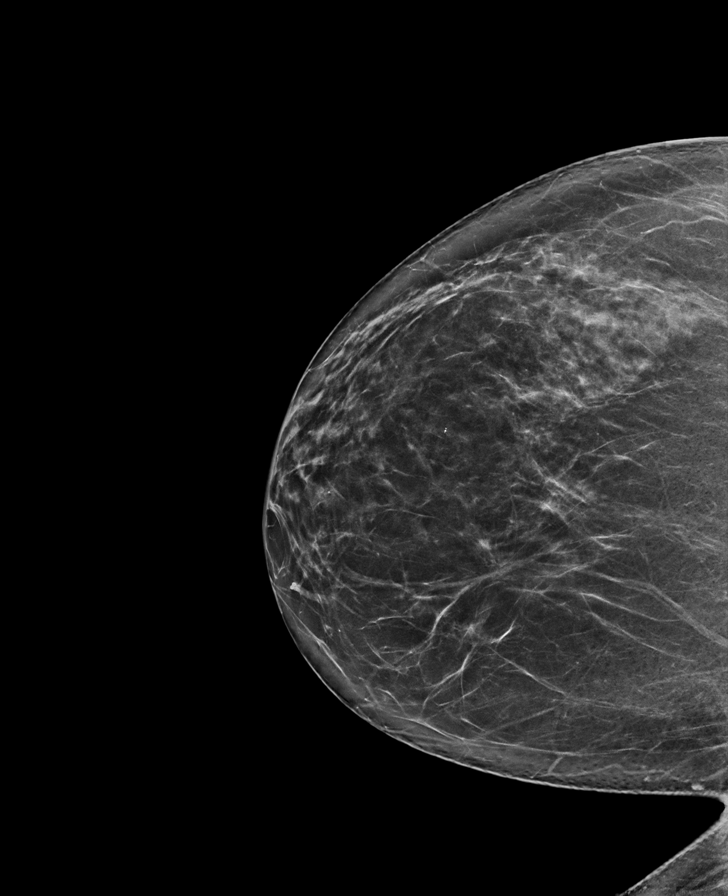

[R MLO synth-2D]
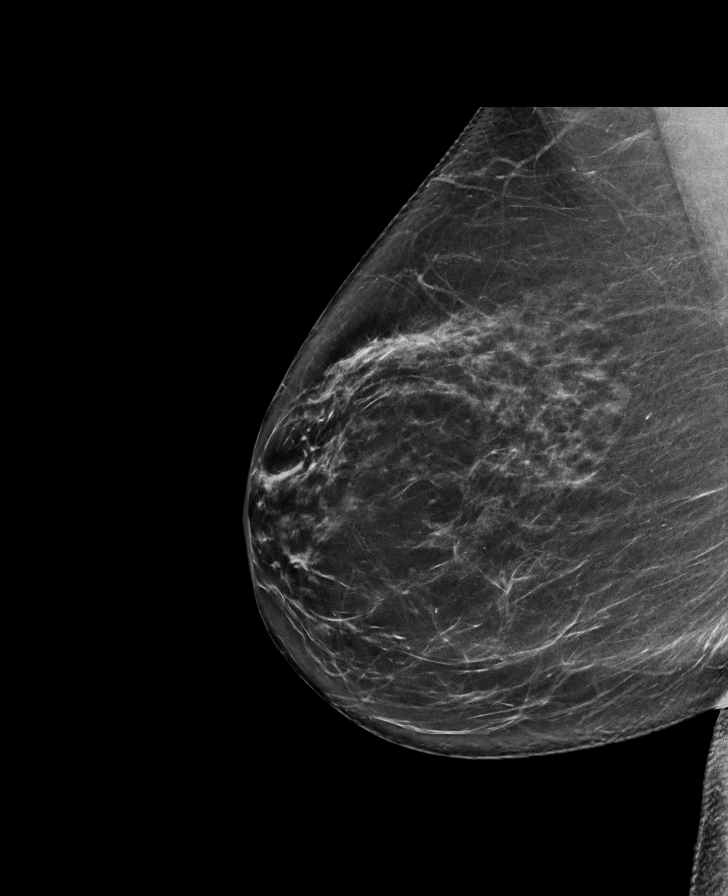

[L CC tomo · tomo slice 39/77.0]
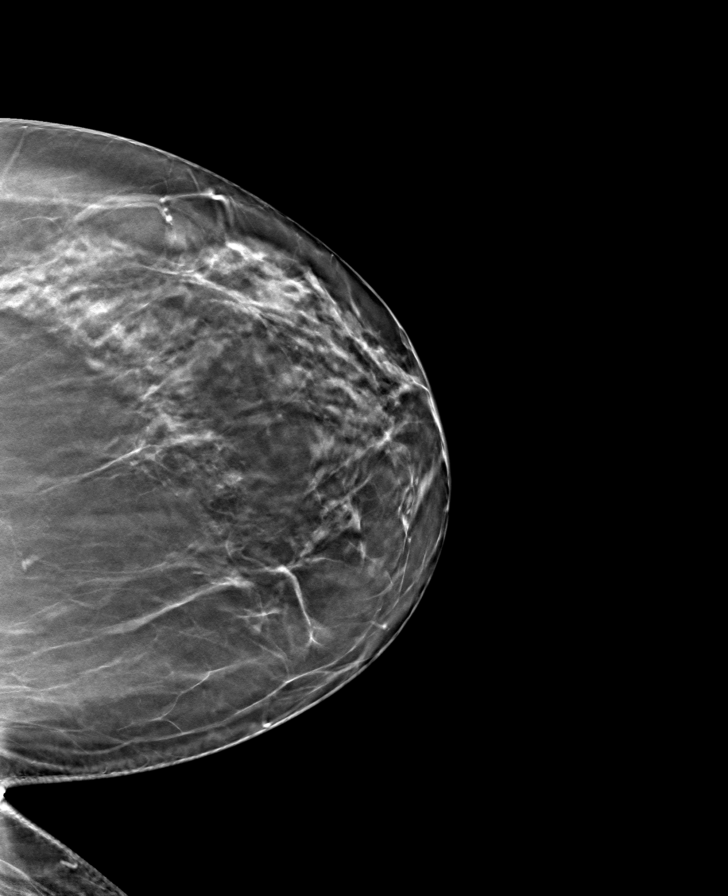

[R MLO tomo · tomo slice 43/84.0]
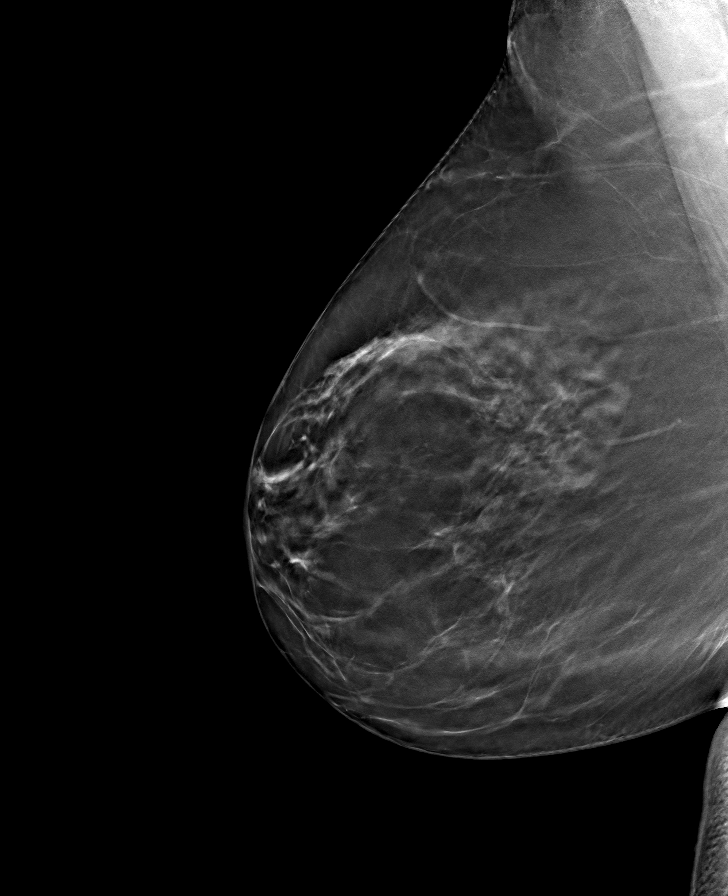

[L MLO tomo · tomo slice 45/90.0]
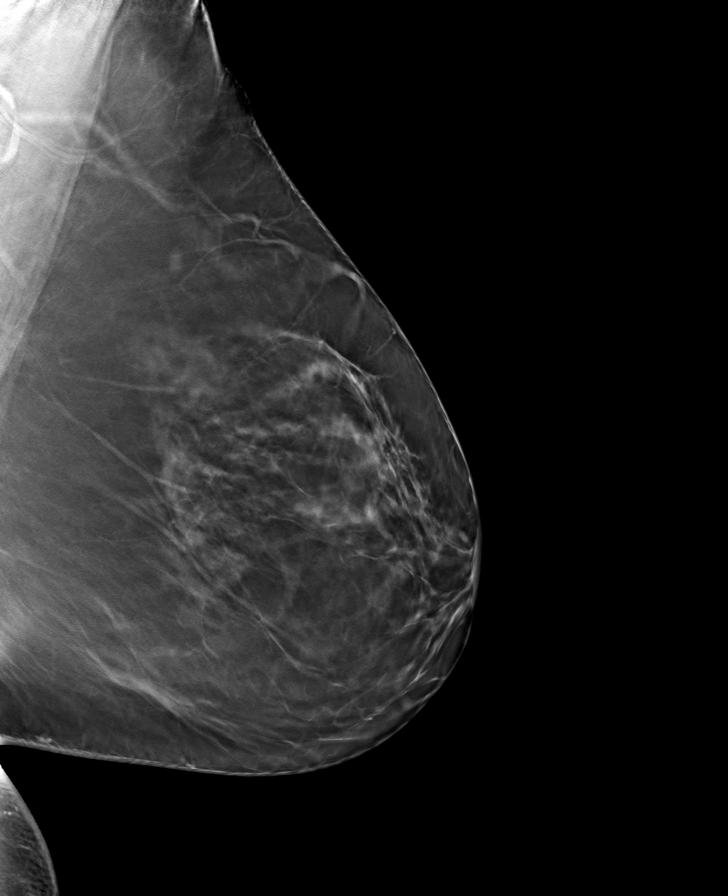

[R CC tomo · tomo slice 39/77.0]
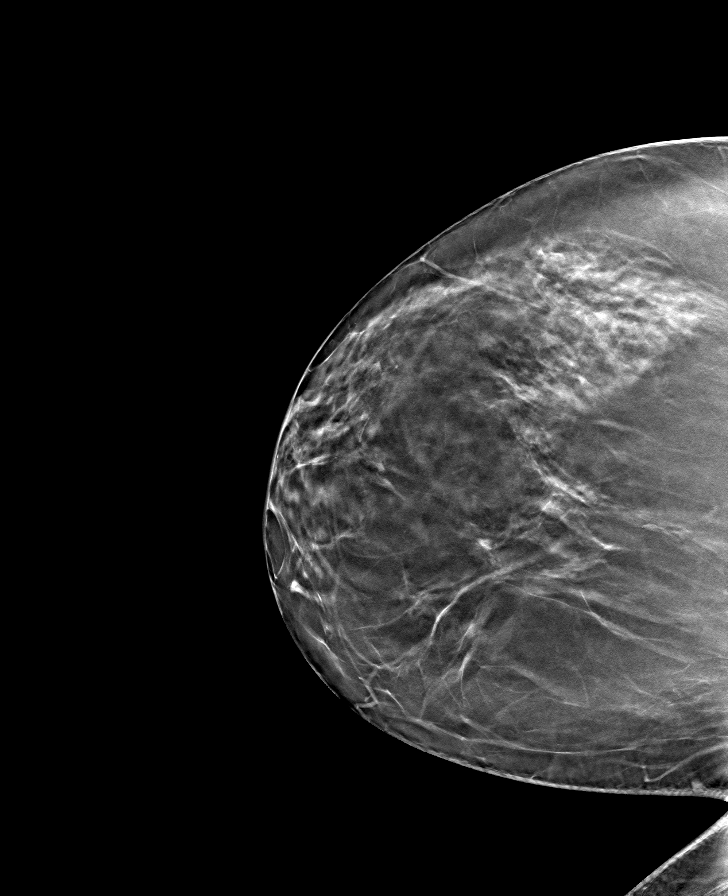

[8 of 24 positions shown; findings below may reference images not displayed]

ACR Breast Density Category b: There are scattered areas of
fibroglandular density.
FINDINGS: There are no findings suspicious for malignancy.
IMPRESSION: No mammographic evidence of malignancy. A result letter of this
screening mammogram will be mailed directly to the patient.

RECOMMENDATION:
Screening mammogram in one year. (Code:51-O-LD2)

BI-RADS CATEGORY  1: Negative.

## 2022-11-20 ENCOUNTER — Other Ambulatory Visit: Payer: Self-pay

## 2022-11-20 DIAGNOSIS — E063 Autoimmune thyroiditis: Secondary | ICD-10-CM

## 2022-11-20 MED ORDER — ARMOUR THYROID 90 MG PO TABS: ORAL_TABLET | ORAL | 1 refills | Status: DC

## 2022-12-25 ENCOUNTER — Other Ambulatory Visit: Payer: Self-pay

## 2022-12-25 DIAGNOSIS — E063 Autoimmune thyroiditis: Secondary | ICD-10-CM

## 2022-12-25 MED ORDER — LEVOTHYROXINE SODIUM 25 MCG PO TABS: ORAL_TABLET | ORAL | 0 refills | Status: DC

## 2022-12-29 ENCOUNTER — Other Ambulatory Visit: Payer: Self-pay

## 2023-02-14 ENCOUNTER — Other Ambulatory Visit: Payer: Self-pay | Admitting: Internal Medicine

## 2023-02-14 DIAGNOSIS — Z1231 Encounter for screening mammogram for malignant neoplasm of breast: Secondary | ICD-10-CM

## 2023-02-19 ENCOUNTER — Ambulatory Visit
Admission: RE | Admit: 2023-02-19 | Discharge: 2023-02-19 | Disposition: A | Discharge status: Home / Self Care | Payer: 59 | Source: Ambulatory Visit

## 2023-02-19 DIAGNOSIS — Z1231 Encounter for screening mammogram for malignant neoplasm of breast: Secondary | ICD-10-CM

## 2023-02-20 ENCOUNTER — Telehealth: Payer: Self-pay

## 2023-02-20 DIAGNOSIS — E063 Autoimmune thyroiditis: Secondary | ICD-10-CM

## 2023-02-20 NOTE — Telephone Encounter (Signed)
Orders Placed This Encounter  Procedures   TSH   T4, free

## 2023-02-21 ENCOUNTER — Other Ambulatory Visit: Payer: 59

## 2023-02-22 ENCOUNTER — Encounter: Payer: Self-pay | Admitting: Endocrinology

## 2023-02-22 LAB — TSH: TSH: 1.86 m[IU]/L (ref 0.40–4.50)

## 2023-02-22 LAB — T4, FREE: Free T4: 1 ng/dL (ref 0.8–1.8)

## 2023-02-26 ENCOUNTER — Encounter: Payer: Self-pay | Admitting: Endocrinology

## 2023-02-26 ENCOUNTER — Ambulatory Visit: Payer: 59 | Admitting: Endocrinology

## 2023-02-26 VITALS — BP 122/70 | HR 92 | Ht 59.0 in | Wt 177.4 lb

## 2023-02-26 DIAGNOSIS — E063 Autoimmune thyroiditis: Secondary | ICD-10-CM | POA: Diagnosis not present

## 2023-02-26 DIAGNOSIS — E039 Hypothyroidism, unspecified: Secondary | ICD-10-CM

## 2023-02-26 MED ORDER — LEVOTHYROXINE SODIUM 25 MCG PO TABS: ORAL_TABLET | ORAL | 3 refills | Status: DC

## 2023-02-26 MED ORDER — ARMOUR THYROID 90 MG PO TABS: ORAL_TABLET | ORAL | 3 refills | Status: DC

## 2023-02-26 NOTE — Progress Notes (Signed)
Outpatient Endocrinology Note [place] Damiyah Ditmars, MD   Patient's Name: Lydia Dillon    DOB: 01/15/1966    MRN: [id number]  REASON OF VISIT: Follow-up for hypothyroidism  PCP: Ollen Bowl, MD  HISTORY OF PRESENT ILLNESS:   Lydia Dillon is a 57 y.o. old female with past medical history as listed below is presented for a follow up for hypothyroidism.   Pertinent Thyroid History: Patient was previously seen by Dr. Lucianne Muss and was last time seen in January 2024.  Patient was diagnosed with hypothyroidism in 1990.  She had baseline symptoms of fatigue, lethargy, brain fog and hair loss at the time of diagnosis.  She had taken Synthroid for several years after the diagnosis.  Because of tiredness, lethargy and difficulty concentrating she was started on Armour Thyroid in 2014, he had significant improvement on the energy level.  She has been on Armour Thyroid 90 mg daily since 2015.  Levothyroxine 25 mcg daily was added in July 2020 when TSH was mildly elevated 5.5 with symptoms of sleepiness.  Levothyroxine was added at that time because of high T3 level and low normal free T4 on Armour Thyroid alone.   Interval history Patient presented for annual follow-up.  She has been taking Armour Thyroid 90 mg daily and levothyroxine 25 mcg daily.  She reports compliance.  Denies palpitation or heat intolerance.  Overall feeling normal energy.  She has some complaints of hair loss otherwise no complaints today.  Recent thyroid function test normal as follows.   Latest Reference Range & Units 02/21/23 14:43  TSH 0.40 - 4.50 mIU/L 1.86  T4,Free(Direct) 0.8 - 1.8 ng/dL 1.0   REVIEW OF SYSTEMS:  As per history of present illness.   PAST MEDICAL HISTORY: Past Medical History:  Diagnosis Date   Fibrocystic breast disease    GERD (gastroesophageal reflux disease)    Herpes simplex virus infection    Thyroid disease     PAST SURGICAL HISTORY: Past Surgical History:  Procedure Laterality Date    LAPAROSCOPY      ALLERGIES: Allergies  Allergen Reactions   Banana Itching    Throat itches   Pineapple Itching    Itchy throat    FAMILY HISTORY:  Family History  Problem Relation Age of Onset   Cancer Mother        breast   Hypertension Mother    Hyperlipidemia Mother    Breast cancer Mother    Heart disease Father    Cancer Paternal Grandmother        breast   Breast cancer Paternal Grandmother    Thyroid disease Maternal Aunt    Thyroid cancer Maternal Aunt    Diabetes Neg Hx     SOCIAL HISTORY: Social History   Socioeconomic History   Marital status: Married    Spouse name: Not on file   Number of children: Not on file   Years of education: Not on file   Highest education level: Not on file  Occupational History   Not on file  Tobacco Use   Smoking status: Former    Current packs/day: 0.00    Types: Cigarettes    Quit date: 08/08/1998    Years since quitting: 24.5   Smokeless tobacco: Never  Substance and Sexual Activity   Alcohol use: Yes    Alcohol/week: 1.0 - 2.0 standard drink of alcohol    Types: 1 - 2 Glasses of wine per week   Drug use: No   Sexual activity:  Not on file  Other Topics Concern   Not on file  Social History Narrative   Not on file   Social Drivers of Health   Financial Resource Strain: Not on file  Food Insecurity: Not on file  Transportation Needs: Not on file  Physical Activity: Not on file  Stress: Not on file  Social Connections: Not on file    MEDICATIONS:  Current Outpatient Medications  Medication Sig Dispense Refill   ARMOUR THYROID 90 MG tablet TAKE 1 TABLET BY MOUTH ONCE DAILY MONDAY-SATURDAY, TAKE 1/2 TABLET (45MG ) BY MOUTH ON SUNDAYS 90 tablet 3   levothyroxine (SYNTHROID) 25 MCG tablet Take 1 tab by mouth once daily in the morning before breakfast 90 tablet 3   No current facility-administered medications for this visit.    PHYSICAL EXAM: Vitals:   02/26/23 1502  BP: 122/70  Pulse: 92  SpO2: 98%   Weight: 177 lb 6.4 oz (80.5 kg)  Height: 4\' 11"  (1.499 m)   Body mass index is 35.83 kg/m.  Wt Readings from Last 3 Encounters:  02/26/23 177 lb 6.4 oz (80.5 kg)  02/20/22 176 lb 4 oz (79.9 kg)  02/17/21 175 lb 9.6 oz (79.7 kg)    General: Well developed, well nourished female in no apparent distress.  HEENT: AT/[place], no external lesions. Hearing intact to the spoken word Eyes: Conjunctiva clear and no icterus. Neck: Trachea midline, neck supple without appreciable thyromegaly or lymphadenopathy and no palpable thyroid nodules Lungs: Clear to auscultation, no wheeze. Respirations not labored Heart: S1S2, Regular in rate and rhythm.  Abdomen: Soft, non tender, non distended Neurologic: Alert, oriented, normal speech, deep tendon biceps reflexes normal,  no gross focal neurological deficit Extremities: No pedal pitting edema, no tremors of outstretched hands Skin: Warm, color good.  Psychiatric: Does not appear depressed or anxious  PERTINENT HISTORIC LABORATORY AND IMAGING STUDIES:  All pertinent laboratory results were reviewed. Please see HPI also for further details.   TSH  Date Value Ref Range Status  02/21/2023 1.86 0.40 - 4.50 mIU/L Final  02/13/2022 1.68 0.35 - 5.50 uIU/mL Final  02/14/2021 1.07 0.35 - 5.50 uIU/mL Final     ASSESSMENT / PLAN  1. Primary hypothyroidism   2. Acquired autoimmune hypothyroidism    -Patient has longstanding history of primary hypothyroidism.  She has been on Armour Thyroid 90 mg daily and levothyroxine 25 mcg daily.  Recent thyroid function test normal.  She has no hypo or hyperthyroid symptoms.  Plan: -Continue current dose of Armour Thyroid 90 mg daily and levothyroxine 25 mcg daily. -Discussed that thyroid medication especially T3 treatment, which is contained in Armour Thyroid, has increased risks of bone loss. -Annual endocrinology follow-up.   Diagnoses and all orders for this visit:  Primary hypothyroidism  Acquired  autoimmune hypothyroidism -     ARMOUR THYROID 90 MG tablet; TAKE 1 TABLET BY MOUTH ONCE DAILY MONDAY-SATURDAY, TAKE 1/2 TABLET (45MG ) BY MOUTH ON SUNDAYS -     levothyroxine (SYNTHROID) 25 MCG tablet; Take 1 tab by mouth once daily in the morning before breakfast -     T3, free -     T4, free -     TSH    DISPOSITION Follow up in clinic in 12 months suggested.  Labs prior to follow-up visit.  All questions answered and patient verbalized understanding of the plan.  [place] Caidyn Henricksen, MD [facility] Endocrinology [facility] [address] [place], [address] [place], [place] [address] Phone # [phone]  At  least part of this note was generated using voice recognition software. Inadvertent word errors may have occurred, which were not recognized during the proofreading process.

## 2023-05-17 ENCOUNTER — Other Ambulatory Visit: Payer: Self-pay | Admitting: Endocrinology

## 2023-05-17 DIAGNOSIS — E063 Autoimmune thyroiditis: Secondary | ICD-10-CM

## 2023-05-18 MED ORDER — ARMOUR THYROID 90 MG PO TABS: ORAL_TABLET | ORAL | 3 refills | Status: DC

## 2024-02-20 ENCOUNTER — Other Ambulatory Visit: Payer: Self-pay | Admitting: Internal Medicine

## 2024-02-20 DIAGNOSIS — Z1231 Encounter for screening mammogram for malignant neoplasm of breast: Secondary | ICD-10-CM

## 2024-02-21 ENCOUNTER — Other Ambulatory Visit: Payer: 59

## 2024-02-21 ENCOUNTER — Ambulatory Visit

## 2024-02-21 DIAGNOSIS — Z1231 Encounter for screening mammogram for malignant neoplasm of breast: Secondary | ICD-10-CM

## 2024-02-22 ENCOUNTER — Ambulatory Visit: Payer: Self-pay | Admitting: Endocrinology

## 2024-02-22 LAB — TSH: TSH: 1.46 m[IU]/L (ref 0.40–4.50)

## 2024-02-22 LAB — T4, FREE: Free T4: 1.4 ng/dL (ref 0.8–1.8)

## 2024-02-22 LAB — T3, FREE: T3, Free: 3.9 pg/mL (ref 2.3–4.2)

## 2024-02-26 ENCOUNTER — Encounter: Payer: Self-pay | Admitting: Endocrinology

## 2024-02-26 ENCOUNTER — Ambulatory Visit: Payer: 59 | Admitting: Endocrinology

## 2024-02-26 VITALS — BP 132/58 | HR 66 | Ht 59.0 in | Wt 172.8 lb

## 2024-02-26 DIAGNOSIS — E039 Hypothyroidism, unspecified: Secondary | ICD-10-CM

## 2024-02-26 DIAGNOSIS — E063 Autoimmune thyroiditis: Secondary | ICD-10-CM

## 2024-02-26 MED ORDER — LEVOTHYROXINE SODIUM 25 MCG PO TABS: ORAL_TABLET | ORAL | 3 refills | Status: AC

## 2024-02-26 MED ORDER — ARMOUR THYROID 90 MG PO TABS: ORAL_TABLET | ORAL | 3 refills | Status: AC

## 2024-02-29 ENCOUNTER — Ambulatory Visit: Admission: RE | Admit: 2024-02-29 | Source: Ambulatory Visit

## 2024-02-29 DIAGNOSIS — Z1231 Encounter for screening mammogram for malignant neoplasm of breast: Secondary | ICD-10-CM

## 2025-02-19 ENCOUNTER — Other Ambulatory Visit

## 2025-02-25 ENCOUNTER — Ambulatory Visit: Admitting: Endocrinology
